# Patient Record
Sex: Female | Born: 1975 | Race: Black or African American | Hispanic: No | Marital: Single | Smoking: Current every day smoker
Health system: Southern US, Community
[De-identification: ages and names within clinical notes are randomized; demographics above are authoritative.]

## PROBLEM LIST (undated history)

## (undated) ENCOUNTER — Inpatient Hospital Stay (HOSPITAL_COMMUNITY): Payer: Self-pay

## (undated) DIAGNOSIS — O139 Gestational [pregnancy-induced] hypertension without significant proteinuria, unspecified trimester: Secondary | ICD-10-CM

## (undated) DIAGNOSIS — I10 Essential (primary) hypertension: Secondary | ICD-10-CM

## (undated) DIAGNOSIS — Z789 Other specified health status: Secondary | ICD-10-CM

## (undated) DIAGNOSIS — IMO0002 Reserved for concepts with insufficient information to code with codable children: Secondary | ICD-10-CM

## (undated) DIAGNOSIS — O24419 Gestational diabetes mellitus in pregnancy, unspecified control: Secondary | ICD-10-CM

## (undated) DIAGNOSIS — O1490 Unspecified pre-eclampsia, unspecified trimester: Secondary | ICD-10-CM

## (undated) DIAGNOSIS — O0930 Supervision of pregnancy with insufficient antenatal care, unspecified trimester: Secondary | ICD-10-CM

## (undated) DIAGNOSIS — K219 Gastro-esophageal reflux disease without esophagitis: Secondary | ICD-10-CM

## (undated) HISTORY — DX: Gastro-esophageal reflux disease without esophagitis: K21.9

## (undated) HISTORY — DX: Essential (primary) hypertension: I10

---

## 2002-03-12 HISTORY — PX: LAPAROSCOPIC OVARIAN: SHX5906

## 2012-10-13 ENCOUNTER — Other Ambulatory Visit (HOSPITAL_COMMUNITY): Payer: Self-pay | Admitting: Family

## 2012-10-13 DIAGNOSIS — Z0489 Encounter for examination and observation for other specified reasons: Secondary | ICD-10-CM

## 2012-10-13 LAB — OB RESULTS CONSOLE ABO/RH: RH Type: POSITIVE

## 2012-10-13 LAB — OB RESULTS CONSOLE PLATELET COUNT: Platelets: 194 10*3/uL

## 2012-10-13 LAB — OB RESULTS CONSOLE ANTIBODY SCREEN: Antibody Screen: NEGATIVE

## 2012-10-13 LAB — OB RESULTS CONSOLE VARICELLA ZOSTER ANTIBODY, IGG: Varicella: IMMUNE

## 2012-10-13 LAB — OB RESULTS CONSOLE RPR
RPR: NONREACTIVE
RPR: NONREACTIVE

## 2012-10-13 LAB — GLUCOSE TOLERANCE, 1 HOUR (50G) W/O FASTING: Glucose, 1 Hour GTT: 152

## 2012-10-13 LAB — OB RESULTS CONSOLE HEPATITIS B SURFACE ANTIGEN: Hepatitis B Surface Ag: NEGATIVE

## 2012-10-13 LAB — OB RESULTS CONSOLE GC/CHLAMYDIA: Chlamydia: NEGATIVE

## 2012-10-13 LAB — OB RESULTS CONSOLE HGB/HCT, BLOOD: Hemoglobin: 10.6 g/dL

## 2012-10-13 LAB — OB RESULTS CONSOLE RUBELLA ANTIBODY, IGM: Rubella: NON-IMMUNE/NOT IMMUNE

## 2012-10-14 LAB — GLUCOSE TOLERANCE, 3 HOURS: Glucose, GTT - 3 Hour: 145 mg/dL — AB (ref ?–140)

## 2012-10-16 ENCOUNTER — Ambulatory Visit (HOSPITAL_COMMUNITY)
Admission: RE | Admit: 2012-10-16 | Discharge: 2012-10-16 | Disposition: A | Discharge status: Home / Self Care | Payer: Medicaid Other | Source: Ambulatory Visit | Attending: Family | Admitting: Family

## 2012-10-16 DIAGNOSIS — Z0489 Encounter for examination and observation for other specified reasons: Secondary | ICD-10-CM

## 2012-10-16 DIAGNOSIS — Z1389 Encounter for screening for other disorder: Secondary | ICD-10-CM | POA: Insufficient documentation

## 2012-10-16 DIAGNOSIS — O09519 Supervision of elderly primigravida, unspecified trimester: Secondary | ICD-10-CM | POA: Insufficient documentation

## 2012-10-16 DIAGNOSIS — O358XX0 Maternal care for other (suspected) fetal abnormality and damage, not applicable or unspecified: Secondary | ICD-10-CM | POA: Insufficient documentation

## 2012-10-16 DIAGNOSIS — Z363 Encounter for antenatal screening for malformations: Secondary | ICD-10-CM | POA: Insufficient documentation

## 2012-10-27 ENCOUNTER — Encounter: Payer: Self-pay | Admitting: *Deleted

## 2012-10-27 ENCOUNTER — Encounter: Payer: Self-pay | Admitting: Family Medicine

## 2012-10-27 ENCOUNTER — Ambulatory Visit (INDEPENDENT_AMBULATORY_CARE_PROVIDER_SITE_OTHER): Payer: Medicaid Other | Admitting: Family Medicine

## 2012-10-27 VITALS — BP 132/90 | Temp 98.2°F | Ht 64.0 in | Wt 194.0 lb

## 2012-10-27 DIAGNOSIS — O99810 Abnormal glucose complicating pregnancy: Secondary | ICD-10-CM

## 2012-10-27 DIAGNOSIS — E119 Type 2 diabetes mellitus without complications: Secondary | ICD-10-CM

## 2012-10-27 DIAGNOSIS — O24419 Gestational diabetes mellitus in pregnancy, unspecified control: Secondary | ICD-10-CM

## 2012-10-27 LAB — POCT URINALYSIS DIP (DEVICE)
Leukocytes, UA: NEGATIVE
Nitrite: NEGATIVE
Protein, ur: NEGATIVE mg/dL
Urobilinogen, UA: 0.2 mg/dL (ref 0.0–1.0)

## 2012-10-27 MED ORDER — GLUCOSE BLOOD VI STRP: ORAL_STRIP | Status: DC

## 2012-10-27 MED ORDER — ACCU-CHEK FASTCLIX LANCETS MISC: 1.0000 [IU] | Freq: Three times a day (TID) | Status: DC

## 2012-10-27 NOTE — Progress Notes (Signed)
GDM Education:Patient [redacted]w[redacted]d,presents with new Diagnosis of GDM. First baby.dispensed Accu ck Nano glucometer. (Lot [id number], exp: 12-09-13) Order for testing supplies provided for 4Xday testing. Testing Procedure education with teach back technique. Patient and significant other verbalized understanding.

## 2012-10-27 NOTE — Progress Notes (Signed)
Pulse- 124 

## 2012-10-27 NOTE — Patient Instructions (Signed)
Gestational Diabetes Mellitus Gestational diabetes mellitus, often simply referred to as gestational diabetes, is a type of diabetes that some women develop during pregnancy. In gestational diabetes, the pancreas does not make enough insulin (a hormone), the cells are less responsive to the insulin that is made (insulin resistance), or both.Normally, insulin moves sugars from food into the tissue cells. The tissue cells use the sugars for energy. The lack of insulin or the lack of normal response to insulin causes excess sugars to build up in the blood instead of going into the tissue cells. As a result, high blood sugar (hyperglycemia) develops. The effect of high sugar (glucose) levels can cause many complications.  RISK FACTORS You have an increased chance of developing gestational diabetes if you have a family history of diabetes and also have one or more of the following risk factors:  A body mass index over 30 (obesity).  A previous pregnancy with gestational diabetes.  An older age at the time of pregnancy. If blood glucose levels are kept in the normal range during pregnancy, women can have a healthy pregnancy. If your blood glucose levels are not well controlled, there may be risks to you, your unborn baby (fetus), your labor and delivery, or your newborn baby.  SYMPTOMS  If symptoms are experienced, they are much like symptoms you would normally expect during pregnancy. The symptoms of gestational diabetes include:   Increased thirst (polydipsia).  Increased urination (polyuria).  Increased urination during the night (nocturia).  Weight loss. This weight loss may be rapid.  Frequent, recurring infections.  Tiredness (fatigue).  Weakness.  Vision changes, such as blurred vision.  Fruity smell to your breath.  Abdominal pain. DIAGNOSIS Diabetes is diagnosed when blood glucose levels are increased. Your blood glucose level may be checked by one or more of the following  blood tests:  A fasting blood glucose test. You will not be allowed to eat for at least 8 hours before a blood sample is taken.  A random blood glucose test. Your blood glucose is checked at any time of the day regardless of when you ate.  A hemoglobin A1c blood glucose test. A hemoglobin A1c test provides information about blood glucose control over the previous 3 months.  An oral glucose tolerance test (OGTT). Your blood glucose is measured after you have not eaten (fasted) for 1 3 hours and then after you drink a glucose-containing beverage. Since the hormones that cause insulin resistance are highest at about 24 28 weeks of a pregnancy, an OGTT is usually performed during that time. If you have risk factors for gestational diabetes, your caregiver may test you for gestational diabetes earlier than 24 weeks of pregnancy. TREATMENT   You will need to take diabetes medicine or insulin daily to keep blood glucose levels in the desired range.  You will need to match insulin dosing with exercise and healthy food choices. The treatment goal is to maintain the before meal (preprandial), bedtime, and overnight blood glucose level at 60 99 mg/dL during pregnancy. The treatment goal is to further maintain peak after meal blood sugar (postprandial glucose) level at 100 140 mg/dL.  HOME CARE INSTRUCTIONS   Have your hemoglobin A1c level checked twice a year.  Perform daily blood glucose monitoring as directed by your caregiver. It is common to perform frequent blood glucose monitoring.  Monitor urine ketones when you are ill and as directed by your caregiver.  Take your diabetes medicine and insulin as directed by your caregiver to   maintain your blood glucose level in the desired range.  Never run out of diabetes medicine or insulin. It is needed every day.  Adjust insulin based on your intake of carbohydrates. Carbohydrates can raise blood glucose levels but need to be included in your diet.  Carbohydrates provide vitamins, minerals, and fiber which are an essential part of a healthy diet. Carbohydrates are found in fruits, vegetables, whole grains, dairy products, legumes, and foods containing added sugars.    Eat healthy foods. Alternate 3 meals with 3 snacks.  Maintain a healthy weight gain. The usual total expected weight gain varies according to your prepregnancy body mass index (BMI).  Carry a medical alert card or wear your medical alert jewelry.  Carry a 15 gram carbohydrate snack with you at all times to treat low blood glucose (hypoglycemia). Some examples of 15 gram carbohydrate snacks include:  Glucose tablets, 3 or 4   Glucose gel, 15 gram tube  Raisins, 2 tablespoons (24 g)  Jelly beans, 6  Animal crackers, 8  Fruit juice, regular soda, or low fat milk, 4 ounces (120 mL)  Gummy treats, 9    Recognize hypoglycemia. Hypoglycemia during pregnancy occurs with blood glucose levels of 60 mg/dL and below. The risk for hypoglycemia increases when fasting or skipping meals, during or after intense exercise, and during sleep. Hypoglycemia symptoms can include:  Tremors or shakes.  Decreased ability to concentrate.  Sweating.  Increased heart rate.  Headache.  Dry mouth.  Hunger.  Irritability.  Anxiety.  Restless sleep.  Altered speech or coordination.  Confusion.  Treat hypoglycemia promptly. If you are alert and able to safely swallow, follow the 15:15 rule:  Take 15 20 grams of rapid-acting glucose or carbohydrate. Rapid-acting options include glucose gel, glucose tablets, or 4 ounces (120 mL) of fruit juice, regular soda, or low fat milk.  Check your blood glucose level 15 minutes after taking the glucose.   Take 15 20 grams more of glucose if the repeat blood glucose level is still 70 mg/dL or below.  Eat a meal or snack within 1 hour once blood glucose levels return to normal.  Be alert to polyuria and polydipsia which are early  signs of hyperglycemia. An early awareness of hyperglycemia allows for prompt treatment. Treat hyperglycemia as directed by your caregiver.  Engage in at least 30 minutes of physical activity a day or as directed by your caregiver. Ten minutes of physical activity timed 30 minutes after each meal is encouraged to control postprandial blood glucose levels.  Adjust your insulin dosing and food intake as needed if you start a new exercise or sport.  Follow your sick day plan at any time you are unable to eat or drink as usual.  Avoid tobacco and alcohol use.  Follow up with your caregiver regularly.  Follow the advice of your caregiver regarding your prenatal and post-delivery (postpartum) appointments, meal planning, exercise, medicines, vitamins, blood tests, other medical tests, and physical activities.  Perform daily skin and foot care. Examine your skin and feet daily for cuts, bruises, redness, nail problems, bleeding, blisters, or sores.  Brush your teeth and gums at least twice a day and floss at least once a day. Follow up with your dentist regularly.  Schedule an eye exam during the first trimester of your pregnancy or as directed by your caregiver.  Share your diabetes management plan with your workplace or school.  Stay up-to-date with immunizations.  Learn to manage stress.  Obtain ongoing diabetes education   and support as needed. SEEK MEDICAL CARE IF:   You are unable to eat food or drink fluids for more than 6 hours.  You have nausea and vomiting for more than 6 hours.  You have a blood glucose level of 200 mg/dL and you have ketones in your urine.  There is a change in mental status.  You develop vision problems.  You have a persistent headache.  You have upper abdominal pain or discomfort.  You develop an additional serious illness.  You have diarrhea for more than 6 hours.  You have been sick or have had a fever for a couple of days and are not getting  better. SEEK IMMEDIATE MEDICAL CARE IF:   You have difficulty breathing.  You no longer feel the baby moving.  You are bleeding or have discharge from your vagina.  You start having premature contractions or labor. MAKE SURE YOU:  Understand these instructions.  Will watch your condition.  Will get help right away if you are not doing well or get worse. Document Released: 06/04/2000 Document Revised: 11/21/2011 Document Reviewed: 09/25/2011 [employer] Patient Information 2014 [employer].  

## 2012-10-27 NOTE — Progress Notes (Signed)
Nutrition note: 1st visit consult & GDM diet education Pt is newly diagnosed GDM pt. Pt has gained 39# @ [redacted]w[redacted]d, which is > expected. Pt reports eating 3 meals & 3-4 snacks/d. Pt reports having nausea occ and heartburn. Pt is taking PNV. Pt received verbal & written education on GDM diet. Discussed wt gain goals of 15-25# or 0.6#/wk. Pt agrees to follow GDM diet with 3 meals & 3 snacks/d with proper CHO/ protein combination. Pt has WIC and plans to BF. F/u in 2-4 wks Blondell Reveal, MS, RD, LDN

## 2012-10-27 NOTE — Progress Notes (Signed)
  Subjective:    Catherine [name] is a 37 y.o. G1P0000 at [redacted]w[redacted]d gestation being seen today for her obstetrical visit. Her obstetrical history is significant for diabetes mellitus, gestational. Other risk factors include: obesity. Pregnancy history fully reviewed. Patient reports nausea, no bleeding, no contractions, no cramping and no leaking. Fetal movement: normal. This is her first visit after having an elevated 3hr glucola.   Menstrual History: OB History   Grav Para Term Preterm Abortions TAB SAB Ect Mult Living   1 0 0 0 0 0 0 0 0 0        No LMP recorded. Patient is pregnant.    The following portions of the patient's history were reviewed and updated as appropriate: allergies, current medications, past family history, past medical history, past social history, past surgical history and problem list.  Review of Systems Pertinent items are noted in HPI.    Objective:    BP 132/90  Temp(Src) 98.2 F (36.8 C)  Ht 5\' 4"  (1.626 m)  Wt 87.998 kg (194 lb)  BMI 33.28 kg/m2 General:   alert, cooperative, appears stated age and no distress  FHT:  150 BPM  Uterine Size: 31 cm and size equals dates   Lab Review Urine dip: neg for glucose      Assessment:    1. Intrauterine pregnancy of [redacted]w[redacted]d, with normal growth. 2. Diabetes Mellitus first visit. Pt to be seen by Diabetic educator, nutrition, and social work. 3. NST: Start after next week evaluation.     No results found for this basename: glucose     Plan:   Catherine [name] is a 37 y.o. G1P0000 at [redacted]w[redacted]d by R=30 here for ROB visit.  Discussed with Patient:  -Plans to breast/bottle feed.  All questions answered. -Continue prenatal vitamins. -Reviewed fetal kick counts Pt to perform daily at a time when the baby is active, lie laterally with both hands on belly in quiet room and count all movements (hiccups, shoulder rolls, obvious kicks, etc); pt is to report to clinic L&D for less than 10 movements felt in a one hour time  period-pt told as soon as she counts 10 movements the count is complete.  - Routine precautions discussed (depression, infection s/s).   Patient provided with all pertinent phone numbers for emergencies. - RTC for any VB, regular, painful cramps/ctxs occurring at a rate of >2/10 min, fever (100.5 or higher), n/v/d, any pain that is unresolving or worsening, LOF, decreased fetal movement, CP, SOB, edema - RTC in 1 weeks for next appt to discuss sugars and plan. - Pt to meet with Nutrition and Diabetic nurse to establish treatment plan.  Problems: Patient Active Problem List   Diagnosis Date Noted  . Gestational diabetes mellitus in pregnancy 10/27/2012    To Do: 1. Tdap thrid trimester  [ ]  Vaccines: Flu:  Tdap:  [ ]  BCM: Undecided  Edu: [x ] PTL precautions; [ ]  BF class; [ ]  childbirth class; [ ]   BF counseling;

## 2012-10-28 DIAGNOSIS — O99810 Abnormal glucose complicating pregnancy: Secondary | ICD-10-CM

## 2012-10-30 ENCOUNTER — Encounter: Payer: Self-pay | Admitting: *Deleted

## 2012-11-03 ENCOUNTER — Ambulatory Visit (INDEPENDENT_AMBULATORY_CARE_PROVIDER_SITE_OTHER): Payer: Medicaid Other | Admitting: Family Medicine

## 2012-11-03 ENCOUNTER — Encounter: Payer: Self-pay | Admitting: *Deleted

## 2012-11-03 VITALS — BP 135/78 | Temp 97.1°F | Wt 197.0 lb

## 2012-11-03 DIAGNOSIS — K219 Gastro-esophageal reflux disease without esophagitis: Secondary | ICD-10-CM

## 2012-11-03 DIAGNOSIS — O99810 Abnormal glucose complicating pregnancy: Secondary | ICD-10-CM

## 2012-11-03 HISTORY — DX: Gastro-esophageal reflux disease without esophagitis: K21.9

## 2012-11-03 LAB — POCT URINALYSIS DIP (DEVICE)
Bilirubin Urine: NEGATIVE
Hgb urine dipstick: NEGATIVE
Ketones, ur: NEGATIVE mg/dL
Leukocytes, UA: NEGATIVE
Specific Gravity, Urine: 1.02 (ref 1.005–1.030)
pH: 7 (ref 5.0–8.0)

## 2012-11-03 NOTE — Progress Notes (Signed)
FBS 96-111--probably not true fasting, secondary to milk drinking in early am.--trial of Zantac for GERD instead of milk 2 hr pp 75-116 NST reviewed and reactive.

## 2012-11-03 NOTE — Progress Notes (Signed)
Nutrition note: f/u GDM diet Pt has gained 42# @ [redacted]w[redacted]d, which is > expected and rapid (3# in 1wk). Pt reports eating 3 meals & 1-2 snacks/d but is hungry throughout the day. Pt's BS range: Fasting-96-111 and 2hr pp- 75-116 Pt has been drinking milk in the middle of the night due to heartburn, which could be 1 reason for elevated fasting BS. Pt is taking PNV. Reviewed GDM diet and discussed snack ideas with pt. Discussed wt gain goals of 0.6#/wk. Pt agrees to stop drinking milk during the night and try Zantac as prescribed by doctor. F/u in 2-4 wks Blondell Reveal, MS, RD, LDN

## 2012-11-03 NOTE — Patient Instructions (Signed)
Gestational Diabetes Mellitus Gestational diabetes mellitus, often simply referred to as gestational diabetes, is a type of diabetes that some women develop during pregnancy. In gestational diabetes, the pancreas does not make enough insulin (a hormone), the cells are less responsive to the insulin that is made (insulin resistance), or both.Normally, insulin moves sugars from food into the tissue cells. The tissue cells use the sugars for energy. The lack of insulin or the lack of normal response to insulin causes excess sugars to build up in the blood instead of going into the tissue cells. As a result, high blood sugar (hyperglycemia) develops. The effect of high sugar (glucose) levels can cause many complications.  RISK FACTORS You have an increased chance of developing gestational diabetes if you have a family history of diabetes and also have one or more of the following risk factors:  A body mass index over 30 (obesity).  A previous pregnancy with gestational diabetes.  An older age at the time of pregnancy. If blood glucose levels are kept in the normal range during pregnancy, women can have a healthy pregnancy. If your blood glucose levels are not well controlled, there may be risks to you, your unborn baby (fetus), your labor and delivery, or your newborn baby.  SYMPTOMS  If symptoms are experienced, they are much like symptoms you would normally expect during pregnancy. The symptoms of gestational diabetes include:   Increased thirst (polydipsia).  Increased urination (polyuria).  Increased urination during the night (nocturia).  Weight loss. This weight loss may be rapid.  Frequent, recurring infections.  Tiredness (fatigue).  Weakness.  Vision changes, such as blurred vision.  Fruity smell to your breath.  Abdominal pain. DIAGNOSIS Diabetes is diagnosed when blood glucose levels are increased. Your blood glucose level may be checked by one or more of the following  blood tests:  A fasting blood glucose test. You will not be allowed to eat for at least 8 hours before a blood sample is taken.  A random blood glucose test. Your blood glucose is checked at any time of the day regardless of when you ate.  A hemoglobin A1c blood glucose test. A hemoglobin A1c test provides information about blood glucose control over the previous 3 months.  An oral glucose tolerance test (OGTT). Your blood glucose is measured after you have not eaten (fasted) for 1 3 hours and then after you drink a glucose-containing beverage. Since the hormones that cause insulin resistance are highest at about 24 28 weeks of a pregnancy, an OGTT is usually performed during that time. If you have risk factors for gestational diabetes, your caregiver may test you for gestational diabetes earlier than 24 weeks of pregnancy. TREATMENT   You will need to take diabetes medicine or insulin daily to keep blood glucose levels in the desired range.  You will need to match insulin dosing with exercise and healthy food choices. The treatment goal is to maintain the before meal (preprandial), bedtime, and overnight blood glucose level at 60 99 mg/dL during pregnancy. The treatment goal is to further maintain peak after meal blood sugar (postprandial glucose) level at 100 140 mg/dL.  HOME CARE INSTRUCTIONS   Have your hemoglobin A1c level checked twice a year.  Perform daily blood glucose monitoring as directed by your caregiver. It is common to perform frequent blood glucose monitoring.  Monitor urine ketones when you are ill and as directed by your caregiver.  Take your diabetes medicine and insulin as directed by your caregiver to   maintain your blood glucose level in the desired range.  Never run out of diabetes medicine or insulin. It is needed every day.  Adjust insulin based on your intake of carbohydrates. Carbohydrates can raise blood glucose levels but need to be included in your diet.  Carbohydrates provide vitamins, minerals, and fiber which are an essential part of a healthy diet. Carbohydrates are found in fruits, vegetables, whole grains, dairy products, legumes, and foods containing added sugars.    Eat healthy foods. Alternate 3 meals with 3 snacks.  Maintain a healthy weight gain. The usual total expected weight gain varies according to your prepregnancy body mass index (BMI).  Carry a medical alert card or wear your medical alert jewelry.  Carry a 15 gram carbohydrate snack with you at all times to treat low blood glucose (hypoglycemia). Some examples of 15 gram carbohydrate snacks include:  Glucose tablets, 3 or 4   Glucose gel, 15 gram tube  Raisins, 2 tablespoons (24 g)  Jelly beans, 6  Animal crackers, 8  Fruit juice, regular soda, or low fat milk, 4 ounces (120 mL)  Gummy treats, 9    Recognize hypoglycemia. Hypoglycemia during pregnancy occurs with blood glucose levels of 60 mg/dL and below. The risk for hypoglycemia increases when fasting or skipping meals, during or after intense exercise, and during sleep. Hypoglycemia symptoms can include:  Tremors or shakes.  Decreased ability to concentrate.  Sweating.  Increased heart rate.  Headache.  Dry mouth.  Hunger.  Irritability.  Anxiety.  Restless sleep.  Altered speech or coordination.  Confusion.  Treat hypoglycemia promptly. If you are alert and able to safely swallow, follow the 15:15 rule:  Take 15 20 grams of rapid-acting glucose or carbohydrate. Rapid-acting options include glucose gel, glucose tablets, or 4 ounces (120 mL) of fruit juice, regular soda, or low fat milk.  Check your blood glucose level 15 minutes after taking the glucose.   Take 15 20 grams more of glucose if the repeat blood glucose level is still 70 mg/dL or below.  Eat a meal or snack within 1 hour once blood glucose levels return to normal.  Be alert to polyuria and polydipsia which are early  signs of hyperglycemia. An early awareness of hyperglycemia allows for prompt treatment. Treat hyperglycemia as directed by your caregiver.  Engage in at least 30 minutes of physical activity a day or as directed by your caregiver. Ten minutes of physical activity timed 30 minutes after each meal is encouraged to control postprandial blood glucose levels.  Adjust your insulin dosing and food intake as needed if you start a new exercise or sport.  Follow your sick day plan at any time you are unable to eat or drink as usual.  Avoid tobacco and alcohol use.  Follow up with your caregiver regularly.  Follow the advice of your caregiver regarding your prenatal and post-delivery (postpartum) appointments, meal planning, exercise, medicines, vitamins, blood tests, other medical tests, and physical activities.  Perform daily skin and foot care. Examine your skin and feet daily for cuts, bruises, redness, nail problems, bleeding, blisters, or sores.  Brush your teeth and gums at least twice a day and floss at least once a day. Follow up with your dentist regularly.  Schedule an eye exam during the first trimester of your pregnancy or as directed by your caregiver.  Share your diabetes management plan with your workplace or school.  Stay up-to-date with immunizations.  Learn to manage stress.  Obtain ongoing diabetes education   and support as needed. SEEK MEDICAL CARE IF:   You are unable to eat food or drink fluids for more than 6 hours.  You have nausea and vomiting for more than 6 hours.  You have a blood glucose level of 200 mg/dL and you have ketones in your urine.  There is a change in mental status.  You develop vision problems.  You have a persistent headache.  You have upper abdominal pain or discomfort.  You develop an additional serious illness.  You have diarrhea for more than 6 hours.  You have been sick or have had a fever for a couple of days and are not getting  better. SEEK IMMEDIATE MEDICAL CARE IF:   You have difficulty breathing.  You no longer feel the baby moving.  You are bleeding or have discharge from your vagina.  You start having premature contractions or labor. MAKE SURE YOU:  Understand these instructions.  Will watch your condition.  Will get help right away if you are not doing well or get worse. Document Released: 06/04/2000 Document Revised: 11/21/2011 Document Reviewed: 09/25/2011 [employer] Patient Information 2014 [employer].  Breastfeeding A change in hormones during your pregnancy causes growth of your breast tissue and an increase in number and size of milk ducts. The hormone prolactin allows proteins, sugars, and fats from your blood supply to make breast milk in your milk-producing glands. The hormone progesterone prevents breast milk from being released before the birth of your baby. After the birth of your baby, your progesterone level decreases allowing breast milk to be released. Thoughts of your baby, as well as his or her sucking or crying, can stimulate the release of milk from the milk-producing glands. Deciding to breastfeed (nurse) is one of the best choices you can make for you and your baby. The information that follows gives a brief review of the benefits, as well as other important skills to know about breastfeeding. BENEFITS OF BREASTFEEDING For your baby  The first milk (colostrum) helps your baby's digestive system function better.   There are antibodies in your milk that help your baby fight off infections.   Your baby has a lower incidence of asthma, allergies, and sudden infant death syndrome (SIDS).   The nutrients in breast milk are better for your baby than infant formulas.  Breast milk improves your baby's brain development.   Your baby will have less gas, colic, and constipation.  Your baby is less likely to develop other conditions, such as childhood obesity, asthma, or diabetes  mellitus. For you  Breastfeeding helps develop a very special bond between you and your baby.   Breastfeeding is convenient, always available at the correct temperature, and costs nothing.   Breastfeeding helps to burn calories and helps you lose the weight gained during pregnancy.   Breastfeeding makes your uterus contract back down to normal size faster and slows bleeding following delivery.   Breastfeeding mothers have a lower risk of developing osteoporosis or breast or ovarian cancer later in life.  BREASTFEEDING FREQUENCY  A healthy, full-term baby may breastfeed as often as every hour or space his or her feedings to every 3 hours. Breastfeeding frequency will vary from baby to baby.   Newborns should be fed no less than every 2 3 hours during the day and every 4 5 hours during the night. You should breastfeed a minimum of 8 feedings in a 24 hour period.  Awaken your baby to breastfeed if it has been 3 4   hours since the last feeding.  Breastfeed when you feel the need to reduce the fullness of your breasts or when your newborn shows signs of hunger. Signs that your baby may be hungry include:  Increased alertness or activity.  Stretching.  Movement of the head from side to side.  Movement of the head and opening of the mouth when the corner of the mouth or cheek is stroked (rooting).  Increased sucking sounds, smacking lips, cooing, sighing, or squeaking.  Hand-to-mouth movements.  Increased sucking of fingers or hands.  Fussing.  Intermittent crying.  Signs of extreme hunger will require calming and consoling before you try to feed your baby. Signs of extreme hunger may include:  Restlessness.  A loud, strong cry.  Screaming.  Frequent feeding will help you make more milk and will help prevent problems, such as sore nipples and engorgement of the breasts.  BREASTFEEDING   Whether lying down or sitting, be sure that the baby's abdomen is facing your  abdomen.   Support your breast with 4 fingers under your breast and your thumb above your nipple. Make sure your fingers are well away from your nipple and your baby's mouth.   Stroke your baby's lips gently with your finger or nipple.   When your baby's mouth is open wide enough, place all of your nipple and as much of the colored area around your nipple (areola) as possible into your baby's mouth.  More areola should be visible above his or her upper lip than below his or her lower lip.  Your baby's tongue should be between his or her lower gum and your breast.  Ensure that your baby's mouth is correctly positioned around the nipple (latched). Your baby's lips should create a seal on your breast.  Signs that your baby has effectively latched onto your nipple include:  Tugging or sucking without pain.  Swallowing heard between sucks.  Absent click or smacking sound.  Muscle movement above and in front of his or her ears with sucking.  Your baby must suck about 2 3 minutes in order to get your milk. Allow your baby to feed on each breast as long as he or she wants. Nurse your baby until he or she unlatches or falls asleep at the first breast, then offer the second breast.  Signs that your baby is full and satisfied include:  A gradual decrease in the number of sucks or complete cessation of sucking.  Falling asleep.  Extension or relaxation of his or her body.  Retention of a small amount of milk in his or her mouth.  Letting go of your breast by himself or herself.  Signs of effective breastfeeding in you include:  Breasts that have increased firmness, weight, and size prior to feeding.  Breasts that are softer after nursing.  Increased milk volume, as well as a change in milk consistency and color by the 5th day of breastfeeding.  Breast fullness relieved by breastfeeding.  Nipples are not sore, cracked, or bleeding.  If needed, break the suction by putting your  finger into the corner of your baby's mouth and sliding your finger between his or her gums. Then, remove your breast from his or her mouth.  It is common for babies to spit up a small amount after a feeding.  Babies often swallow air during feeding. This can make babies fussy. Burping your baby between breasts can help with this.  Vitamin D supplements are recommended for babies who get only   breast milk.  Avoid using a pacifier during your baby's first 4 6 weeks.  Avoid supplemental feedings of water, formula, or juice in place of breastfeeding. Breast milk is all the food your baby needs. It is not necessary for your baby to have water or formula. Your breasts will make more milk if supplemental feedings are avoided during the early weeks. HOW TO TELL WHETHER YOUR BABY IS GETTING ENOUGH BREAST MILK Wondering whether or not your baby is getting enough milk is a common concern among mothers. You can be assured that your baby is getting enough milk if:   Your baby is actively sucking and you hear swallowing.   Your baby seems relaxed and satisfied after a feeding.   Your baby nurses at least 8 12 times in a 24 hour time period.  During the first 3 5 days of age:  Your baby is wetting at least 3 5 diapers in a 24 hour period. The urine should be clear and pale yellow.  Your baby is having at least 3 4 stools in a 24 hour period. The stool should be soft and yellow.  At 5 7 days of age, your baby is having at least 3 6 stools in a 24 hour period. The stool should be seedy and yellow by 5 days of age.  Your baby has a weight loss less than 7 10% during the first 3 days of age.  Your baby does not lose weight after 3 7 days of age.  Your baby gains 4 7 ounces each week after he or she is 4 days of age.  Your baby gains weight by 5 days of age and is back to birth weight within 2 weeks. ENGORGEMENT In the first week after your baby is born, you may experience extremely full breasts  (engorgement). When engorged, your breasts may feel heavy, warm, or tender to the touch. Engorgement peaks within 24 48 hours after delivery of your baby.  Engorgement may be reduced by:  Continuing to breastfeed.  Increasing the frequency of breastfeeding.  Taking warm showers or applying warm, moist heat to your breasts just before each feeding. This increases circulation and helps the milk flow.   Gently massaging your breast before and during the feedings. With your fingertips, massage from your chest wall towards your nipple in a circular motion.   Ensuring that your baby empties at least one breast at every feeding. It also helps to start the next feeding on the opposite breast.   Expressing breast milk by hand or by using a breast pump to empty the breasts if your baby is sleepy, or not nursing well. You may also want to express milk if you are returning to work oryou feel you are getting engorged.  Ensuring your baby is latched on and positioned properly while breastfeeding. If you follow these suggestions, your engorgement should improve in 24 48 hours. If you are still experiencing difficulty, call your lactation consultant or caregiver.  CARING FOR YOURSELF Take care of your breasts.  Bathe or shower daily.   Avoid using soap on your nipples.   Wear a supportive bra. Avoid wearing underwire style bras.  Air dry your nipples for a 3 4minutes after each feeding.   Use only cotton bra pads to absorb breast milk leakage. Leaking of breast milk between feedings is normal.   Use only pure lanolin on your nipples after nursing. You do not need to wash it off before feeding your baby again.   Another option is to express a few drops of breast milk and gently massage that milk into your nipples.  Continue breast self-awareness checks. Take care of yourself.  Eat healthy foods. Alternate 3 meals with 3 snacks.  Avoid foods that you notice affect your baby in a bad  way.  Drink milk, fruit juice, and water to satisfy your thirst (about 8 glasses a day).   Rest often, relax, and take your prenatal vitamins to prevent fatigue, stress, and anemia.  Avoid chewing and smoking tobacco.  Avoid alcohol and drug use.  Take over-the-counter and prescribed medicine only as directed by your caregiver or pharmacist. You should always check with your caregiver or pharmacist before taking any new medicine, vitamin, or herbal supplement.  Know that pregnancy is possible while breastfeeding. If desired, talk to your caregiver about family planning and safe birth control methods that may be used while breastfeeding. SEEK MEDICAL CARE IF:   You feel like you want to stop breastfeeding or have become frustrated with breastfeeding.  You have painful breasts or nipples.  Your nipples are cracked or bleeding.  Your breasts are red, tender, or warm.  You have a swollen area on either breast.  You have a fever or chills.  You have nausea or vomiting.  You have drainage from your nipples.  Your breasts do not become full before feedings by the 5th day after delivery.  You feel sad and depressed.  Your baby is too sleepy to eat well.  Your baby is having trouble sleeping.   Your baby is wetting less than 3 diapers in a 24 hour period.  Your baby has less than 3 stools in a 24 hour period.  Your baby's skin or the white part of his or her eyes becomes more yellow.   Your baby is not gaining weight by 5 days of age. MAKE SURE YOU:   Understand these instructions.  Will watch your condition.  Will get help right away if you are not doing well or get worse. Document Released: 02/26/2005 Document Revised: 11/21/2011 Document Reviewed: 10/03/2011 [employer] Patient Information 2014 [employer].  

## 2012-11-03 NOTE — Progress Notes (Signed)
Pulse 105 Edema trace in face.

## 2012-11-03 NOTE — Progress Notes (Signed)
Pt to start NST protocol today if CBG's require medication for control.  Pt has not had bedtime snack routinely but has had milk at 0100 due to heartburn.  CBG log book reviewed with Lenor Coffin RN to determine if medication indicated for glucose control. Fastings 98-111, PP all wnl range.  She recommends Glyburide 2.5 mg @ bedtime daily. NST protocol initiated.

## 2012-11-07 ENCOUNTER — Ambulatory Visit (INDEPENDENT_AMBULATORY_CARE_PROVIDER_SITE_OTHER): Payer: Medicaid Other | Admitting: *Deleted

## 2012-11-07 ENCOUNTER — Ambulatory Visit (HOSPITAL_COMMUNITY)
Admission: RE | Admit: 2012-11-07 | Discharge: 2012-11-07 | Disposition: A | Discharge status: Home / Self Care | Payer: Medicaid Other | Source: Ambulatory Visit | Attending: Obstetrics & Gynecology | Admitting: Obstetrics & Gynecology

## 2012-11-07 ENCOUNTER — Encounter (HOSPITAL_COMMUNITY): Payer: Self-pay | Admitting: *Deleted

## 2012-11-07 ENCOUNTER — Inpatient Hospital Stay (HOSPITAL_COMMUNITY)
Admission: AD | Admit: 2012-11-07 | Discharge: 2012-11-07 | Disposition: A | Discharge status: Home / Self Care | Payer: Medicaid Other | Source: Ambulatory Visit | Attending: Obstetrics & Gynecology | Admitting: Obstetrics & Gynecology

## 2012-11-07 VITALS — BP 125/82

## 2012-11-07 DIAGNOSIS — O163 Unspecified maternal hypertension, third trimester: Secondary | ICD-10-CM | POA: Diagnosis present

## 2012-11-07 DIAGNOSIS — O99810 Abnormal glucose complicating pregnancy: Secondary | ICD-10-CM | POA: Insufficient documentation

## 2012-11-07 DIAGNOSIS — O4700 False labor before 37 completed weeks of gestation, unspecified trimester: Secondary | ICD-10-CM | POA: Insufficient documentation

## 2012-11-07 DIAGNOSIS — R030 Elevated blood-pressure reading, without diagnosis of hypertension: Secondary | ICD-10-CM

## 2012-11-07 DIAGNOSIS — O09519 Supervision of elderly primigravida, unspecified trimester: Secondary | ICD-10-CM | POA: Insufficient documentation

## 2012-11-07 DIAGNOSIS — O368390 Maternal care for abnormalities of the fetal heart rate or rhythm, unspecified trimester, not applicable or unspecified: Secondary | ICD-10-CM

## 2012-11-07 DIAGNOSIS — O289 Unspecified abnormal findings on antenatal screening of mother: Secondary | ICD-10-CM | POA: Insufficient documentation

## 2012-11-07 HISTORY — DX: Gestational diabetes mellitus in pregnancy, unspecified control: O24.419

## 2012-11-07 LAB — URINALYSIS, ROUTINE W REFLEX MICROSCOPIC
Glucose, UA: NEGATIVE mg/dL
Hgb urine dipstick: NEGATIVE
Specific Gravity, Urine: <1.005 — ABNORMAL LOW (ref 1.005–1.030)
Urobilinogen, UA: 0.2 mg/dL (ref 0.0–1.0)

## 2012-11-07 LAB — COMPREHENSIVE METABOLIC PANEL
AST: 14 U/L (ref 0–37)
Albumin: 2.5 g/dL — ABNORMAL LOW (ref 3.5–5.2)
Alkaline Phosphatase: 96 U/L (ref 39–117)
CO2: 21 mEq/L (ref 19–32)
Chloride: 101 mEq/L (ref 96–112)
Creatinine, Ser: 0.5 mg/dL (ref 0.50–1.10)
GFR calc non Af Amer: >90 mL/min (ref >90)
Potassium: 3.6 mEq/L (ref 3.5–5.1)
Total Bilirubin: 0.2 mg/dL — ABNORMAL LOW (ref 0.3–1.2)

## 2012-11-07 LAB — CBC
HCT: 30.3 % — ABNORMAL LOW (ref 36.0–46.0)
MCV: 85.6 fL (ref 78.0–100.0)
Platelets: 190 10*3/uL (ref 150–400)
RBC: 3.54 MIL/uL — ABNORMAL LOW (ref 3.87–5.11)
RDW: 13.8 % (ref 11.5–15.5)
WBC: 9 10*3/uL (ref 4.0–10.5)

## 2012-11-07 NOTE — MAU Provider Note (Signed)
  History     CSN: [id number]  Arrival date and time: 11/07/12 1115   First Provider Initiated Contact with Patient 11/07/12 1151      Chief Complaint  Patient presents with  . fetal monitor    HPI Comments: [name] is a 36yo G1 at [redacted]w[redacted]d with A1 GDM seen in clinic today for an NST, which she gets twice weekly. Her NST was non-reactive and BPP was 8/8. She was sent to MAU for continued monitoring. She has no history of LOF, vaginal bleeding or contractions. She feels baby moving regularly with last noticed fetal movement this morning. No complaints otherwise. Her fasting blood sugars are in the 90s range with postprandial readings less than 120.  FHT: 150bpm, moderate variability, +accelerations, no decels, Category I tracing UC: irregular every 4-5 minutes  OB History   Grav Para Term Preterm Abortions TAB SAB Ect Mult Living   1 0 0 0 0 0 0 0 0 0       Past Medical History  Diagnosis Date  . Gestational diabetes     Past Surgical History  Procedure Laterality Date  . Laparoscopic ovarian  2004    History reviewed. No pertinent family history.  History  Substance Use Topics  . Smoking status: Never Smoker   . Smokeless tobacco: Never Used  . Alcohol Use: No    Allergies: No Known Allergies  Prescriptions prior to admission  Medication Sig Dispense Refill  . Prenatal Vit-Fe Fumarate-FA (PRENATAL MULTIVITAMIN) TABS tablet Take 1 tablet by mouth daily at 12 noon.      . ranitidine (ZANTAC) 150 MG tablet Take 150 mg by mouth daily.      Marland Kitchen ACCU-CHEK FASTCLIX LANCETS MISC 1 Units by Does not apply route 4 (four) times daily - after meals and at bedtime.  360 each  1  . glucose blood (ACCU-CHEK SMARTVIEW) test strip Use as instructed  100 each  12    Review of Systems  Constitutional: Negative for fever and chills.  Eyes: Positive for blurred vision.       History of blurry vision. Currently no symptoms  Respiratory: Negative for shortness of breath.    Gastrointestinal: Positive for nausea. Negative for vomiting, abdominal pain, diarrhea and constipation.  Genitourinary: Negative for dysuria.  Musculoskeletal: Negative for back pain.  Neurological: Negative for headaches.   Physical Exam   Blood pressure 139/82, pulse 97, temperature 98.8 F (37.1 C), temperature source Oral, resp. rate 20.  Physical Exam  Nursing note reviewed. Constitutional: She is oriented to person, place, and time. She appears well-developed and well-nourished.  Cardiovascular: Regular rhythm and normal heart sounds.  Tachycardia present.   No murmur heard. Respiratory: Effort normal and breath sounds normal.  GI: There is no tenderness.  Musculoskeletal: Normal range of motion. She exhibits edema. She exhibits no tenderness.  Trace right ankle edema  Neurological: She is alert and oriented to person, place, and time. She has normal reflexes.  Skin: Skin is warm and dry. No erythema.    MAU Course  Procedures  MDM - Watch for 1 hour on fetal monitoring for decelerations  Assessment and Plan  37 yo G1 at [redacted]w[redacted]d with A1 GDM s/p non-reactive NST and 8/8 BPP. Fetal monitoring is reactive with 2x 10x10 accelerations and 1x 15x15 accerlation. - discharge home - f/u at clinic next week  Jacquelin Hawking, MD 11/07/2012, 12:15 PM   Please see edited note completed by Sid Falcon, CNM [facility]

## 2012-11-07 NOTE — Progress Notes (Signed)
P = 100   Dr. Marice Potter notified of NST result and UC pattern.  Pt sent to [place] dept for BPP, then will have further EFM and evaluation in MAU.

## 2012-11-07 NOTE — MAU Note (Signed)
Pt sent from clinic for non-reactive NST, had BPP that was 8/8, here for further monitoring.

## 2012-11-07 NOTE — Progress Notes (Signed)
CSN: [id number]   Arrival date and time: 11/07/12 1115    First Provider Initiated Contact with Patient 11/07/12 1151          Chief Complaint   Patient presents with   .  fetal monitor     HPI Comments: Catherine [name] is a 36yo G1 at [redacted]w[redacted]d with A1 GDM seen in clinic today for an NST, which she gets twice weekly. Her NST was non-reactive and BPP was 8/8. She was sent to MAU for continued monitoring. She has no history of LOF, vaginal bleeding or contractions. She feels baby moving regularly with last noticed fetal movement this morning. No complaints otherwise. Her fasting blood sugars are in the 90s range with postprandial readings less than 120.  No report of headache, epigastric pain, or vision changes.    FHT: 150bpm, moderate variability, +accelerations, no decels, Category I tracing UC: irregular every 4-5 minutes    OB History     Grav  Para  Term  Preterm  Abortions  TAB  SAB  Ect  Mult  Living     1  0  0  0  0  0  0  0  0  0       Past Medical History   Diagnosis  Date   .  Gestational diabetes         Past Surgical History   Procedure  Laterality  Date   .  Laparoscopic ovarian    2004      History reviewed. No pertinent family history.    History   Substance Use Topics   .  Smoking status:  Never Smoker    .  Smokeless tobacco:  Never Used   .  Alcohol Use:  No      Allergies: No Known Allergies    Prescriptions prior to admission   Medication  Sig  Dispense  Refill   .  Prenatal Vit-Fe Fumarate-FA (PRENATAL MULTIVITAMIN) TABS tablet  Take 1 tablet by mouth daily at 12 noon.         .  ranitidine (ZANTAC) 150 MG tablet  Take 150 mg by mouth daily.         Marland Kitchen  ACCU-CHEK FASTCLIX LANCETS MISC  1 Units by Does not apply route 4 (four) times daily - after meals and at bedtime.   360 each   1   .  glucose blood (ACCU-CHEK SMARTVIEW) test strip  Use as instructed   100 each   12      Review of Systems  Constitutional: Negative for fever and chills.  Eyes:  Positive for blurred vision.        History of blurry vision. Currently no symptoms  Respiratory: Negative for shortness of breath.   Gastrointestinal: Positive for nausea. Negative for vomiting, abdominal pain, diarrhea and constipation.  Genitourinary: Negative for dysuria.  Musculoskeletal: Negative for back pain.  Neurological: Negative for headaches.  Physical Exam      Blood pressure 139/82, pulse 97, temperature 98.8 F (37.1 C), temperature source Oral, resp. rate 20.   Physical Exam  Nursing note reviewed.  Filed Vitals:   11/07/12 1128 11/07/12 1200 11/07/12 1258 11/07/12 1513  BP: 145/92 139/82 146/83 139/84  Pulse: 103 97 98 103  Temp: 98.8 F (37.1 C)     TempSrc: Oral     Resp: 20   20    Constitutional: She is oriented to person, place, and time. She appears well-developed and well-nourished.  Cardiovascular:  Regular rhythm and normal heart sounds.  Tachycardia present.    No murmur heard. Respiratory: Effort normal and breath sounds normal.  GI: There is no tenderness.  Musculoskeletal: Normal range of motion. She exhibits edema - 1+bilat pedal. She exhibits no tenderness.  Trace right ankle edema  Neurological: She is alert and oriented to person, place, and time. She has normal reflexes.  Skin: Skin is warm and dry. No erythema.     MAU Course    Results for orders placed during the hospital encounter of 11/07/12 (from the past 24 hour(s))  URINALYSIS, ROUTINE W REFLEX MICROSCOPIC     Status: Abnormal   Collection Time    11/07/12  1:50 PM      Result Value Range   Color, Urine YELLOW  YELLOW   APPearance CLEAR  CLEAR   Specific Gravity, Urine <1.005 (*) 1.005 - 1.030   pH 6.5  5.0 - 8.0   Glucose, UA NEGATIVE  NEGATIVE mg/dL   Hgb urine dipstick NEGATIVE  NEGATIVE   Bilirubin Urine NEGATIVE  NEGATIVE   Ketones, ur 15 (*) NEGATIVE mg/dL   Protein, ur NEGATIVE  NEGATIVE mg/dL   Urobilinogen, UA 0.2  0.0 - 1.0 mg/dL   Nitrite NEGATIVE  NEGATIVE    Leukocytes, UA NEGATIVE  NEGATIVE  PROTEIN / CREATININE RATIO, URINE     Status: Abnormal   Collection Time    11/07/12  1:50 PM      Result Value Range   Creatinine, Urine 20.80     Total Protein, Urine 5.1     PROTEIN CREATININE RATIO 0.25 (*) 0.00 - 0.15  CBC     Status: Abnormal   Collection Time    11/07/12  3:05 PM      Result Value Range   WBC 9.0  4.0 - 10.5 K/uL   RBC 3.54 (*) 3.87 - 5.11 MIL/uL   Hemoglobin 10.1 (*) 12.0 - 15.0 g/dL   HCT 16.1 (*) 09.6 - 04.5 %   MCV 85.6  78.0 - 100.0 fL   MCH 28.5  26.0 - 34.0 pg   MCHC 33.3  30.0 - 36.0 g/dL   RDW 40.9  81.1 - 91.4 %   Platelets 190  150 - 400 K/uL  COMPREHENSIVE METABOLIC PANEL     Status: Abnormal   Collection Time    11/07/12  3:05 PM      Result Value Range   Sodium 135  135 - 145 mEq/L   Potassium 3.6  3.5 - 5.1 mEq/L   Chloride 101  96 - 112 mEq/L   CO2 21  19 - 32 mEq/L   Glucose, Bld 162 (*) 70 - 99 mg/dL   BUN 5 (*) 6 - 23 mg/dL   Creatinine, Ser 7.82  0.50 - 1.10 mg/dL   Calcium 9.4  8.4 - 95.6 mg/dL   Total Protein 5.9 (*) 6.0 - 8.3 g/dL   Albumin 2.5 (*) 3.5 - 5.2 g/dL   AST 14  0 - 37 U/L   ALT 10  0 - 35 U/L   Alkaline Phosphatase 96  39 - 117 U/L   Total Bilirubin 0.2 (*) 0.3 - 1.2 mg/dL   GFR calc non Af Amer >90  >90 mL/min   GFR calc Af Amer >90  >90 mL/min    MDM - Watch for 1 hour on fetal monitoring for decelerations > no decelerations  Consulted with Dr. Marice Potter > Reviewed HPI/Exam/labs > continue follow-up next week  Assessment  and Plan    37 yo G1 at [redacted]w[redacted]d with A1 GDM s/p non-reactive NST and 8/8 BPP.  Elevated Blood Pressure - normal labs  Plan: Discharge home PreX Precautions Keep scheduled appt for NST.  [facility] 11/07/12

## 2012-11-07 NOTE — Progress Notes (Signed)
Please obtain blood pressure at next appt for NST

## 2012-11-11 ENCOUNTER — Ambulatory Visit (INDEPENDENT_AMBULATORY_CARE_PROVIDER_SITE_OTHER): Payer: Medicaid Other | Admitting: *Deleted

## 2012-11-11 VITALS — BP 139/87

## 2012-11-11 DIAGNOSIS — O163 Unspecified maternal hypertension, third trimester: Secondary | ICD-10-CM

## 2012-11-11 DIAGNOSIS — O139 Gestational [pregnancy-induced] hypertension without significant proteinuria, unspecified trimester: Secondary | ICD-10-CM

## 2012-11-11 DIAGNOSIS — O99810 Abnormal glucose complicating pregnancy: Secondary | ICD-10-CM

## 2012-11-11 NOTE — Progress Notes (Signed)
NST reviewed and reactive.  Daryll Spisak L. Harraway-Smith, M.D., FACOG    

## 2012-11-11 NOTE — Progress Notes (Signed)
P = 107 

## 2012-11-12 ENCOUNTER — Encounter: Payer: Self-pay | Admitting: *Deleted

## 2012-11-14 ENCOUNTER — Ambulatory Visit (INDEPENDENT_AMBULATORY_CARE_PROVIDER_SITE_OTHER): Payer: Medicaid Other | Admitting: *Deleted

## 2012-11-14 VITALS — BP 133/83

## 2012-11-14 DIAGNOSIS — O139 Gestational [pregnancy-induced] hypertension without significant proteinuria, unspecified trimester: Secondary | ICD-10-CM

## 2012-11-14 DIAGNOSIS — O99810 Abnormal glucose complicating pregnancy: Secondary | ICD-10-CM

## 2012-11-14 DIAGNOSIS — O163 Unspecified maternal hypertension, third trimester: Secondary | ICD-10-CM

## 2012-11-14 NOTE — Progress Notes (Signed)
NST performed today was reviewed and was found to be reactive.  AFI normal at 12.9 cm. Continue recommended antenatal testing and prenatal care.

## 2012-11-14 NOTE — Progress Notes (Signed)
P-103 

## 2012-11-17 ENCOUNTER — Ambulatory Visit (INDEPENDENT_AMBULATORY_CARE_PROVIDER_SITE_OTHER): Payer: Medicaid Other | Admitting: Obstetrics & Gynecology

## 2012-11-17 VITALS — BP 135/83 | Wt 199.5 lb

## 2012-11-17 DIAGNOSIS — O163 Unspecified maternal hypertension, third trimester: Secondary | ICD-10-CM

## 2012-11-17 DIAGNOSIS — R82998 Other abnormal findings in urine: Secondary | ICD-10-CM

## 2012-11-17 DIAGNOSIS — R8290 Unspecified abnormal findings in urine: Secondary | ICD-10-CM

## 2012-11-17 DIAGNOSIS — O99810 Abnormal glucose complicating pregnancy: Secondary | ICD-10-CM

## 2012-11-17 DIAGNOSIS — O139 Gestational [pregnancy-induced] hypertension without significant proteinuria, unspecified trimester: Secondary | ICD-10-CM

## 2012-11-17 LAB — POCT URINALYSIS DIP (DEVICE)
Bilirubin Urine: NEGATIVE
Hgb urine dipstick: NEGATIVE
Ketones, ur: NEGATIVE mg/dL
Protein, ur: NEGATIVE mg/dL
Specific Gravity, Urine: 1.015 (ref 1.005–1.030)
pH: 7 (ref 5.0–8.0)

## 2012-11-17 NOTE — Progress Notes (Signed)
U/S scheduled 11/20/12 at 830 am.

## 2012-11-17 NOTE — Progress Notes (Signed)
Fasting BG in the 90s, two values above 100. Patient had normal fasting values in previous weeks.  She says she occasionally drinks juice in the morning prior to checking BG.  Recommended water instead. Will reevaluate next week. If fastings still elevated, consider Glyburide qhs.  Will check growth scan on 11/20/12. NST performed today was reviewed and was found to be reactive.  Continue recommended antenatal testing and prenatal care. Counseled about Tdap, she will think about this and decide soon.  UA showed + nitrites, otherwise normal.  Patient has no symptoms, will follow up urine culture.  No other complaints or concerns.  Preeclampsia, fetal movement and labor precautions reviewed.

## 2012-11-17 NOTE — Progress Notes (Signed)
P = 95   Pt denies H/A or visual disturbances.  Pt is not aware of UC's during NST

## 2012-11-17 NOTE — Patient Instructions (Signed)
Tetanus, Diphtheria (Td) or Tetanus, Diphtheria, Pertussis (Tdap) Vaccine What You Need to Know WHY GET VACCINATED? Tetanus, diphtheria and pertussis can be very serious diseases. TETANUS (Lockjaw) causes painful muscle spasms and stiffness, usually all over the body.  Tetanus can lead to tightening of muscles in the head and neck so the victim cannot open his mouth or swallow, or sometimes even breathe. Tetanus kills about 1 out of 5 people who are infected. DIPHTHERIA can cause a thick membrane to cover the back of the throat.  Diphtheria can lead to breathing problems, paralysis, heart failure, and even death. PERTUSSIS (Whooping Cough) causes severe coughing spells which can lead to difficulty breathing, vomiting, and disturbed sleep.  Pertussis can lead to weight loss, incontinence, rib fractures and passing out from violent coughing. Up to 2 in 100 adolescents and 5 in 100 adults with pertussis are hospitalized or have complications, including pneumonia and death. These 3 diseases are all caused by bacteria. Diphtheria and pertussis are spread from person to person. Tetanus enters the body through cuts, scratches, or wounds. The [place] saw as many as 200,000 cases a year of diphtheria and pertussis before vaccines were available, and hundreds of cases of tetanus. Since then, tetanus and diphtheria cases have dropped by about 99% and pertussis cases by about 92%. Children 6 years of age and younger get DTaP vaccine to protect them from these three diseases. But older children, adolescents, and adults need protection too. VACCINES FOR ADOLESCENTS AND ADULTS: TD AND TDAP Two vaccines are available to protect people 7 years of age and older from these diseases:  Td vaccine has been used for many years. It protects against tetanus and diphtheria.  Tdap vaccine was licensed in 2005. It is the first vaccine for adolescents and adults that protects against pertussis as well as tetanus and  diphtheria. A Td booster dose is recommended every 10 years. Tdap is given only once. WHICH VACCINE, AND WHEN? Ages 7 through 18 years  A dose of Tdap is recommended at age 11 or 12. This dose could be given as early as age 7 for children who missed one or more childhood doses of DTaP.  Children and adolescents who did not get a complete series of DTaP shots by age 7 should complete the series using a combination of Td and Tdap. Age 19 years and Older  All adults should get a booster dose of Td every 10 years. Adults under 65 who have never gotten Tdap should get a dose of Tdap as their next booster dose. Adults 65 and older may get one booster dose of Tdap.  Adults (including women who may become pregnant and adults 65 and older) who expect to have close contact with a baby younger than 12 months of age should get a dose of Tdap to help protect the baby from pertussis.  Healthcare professionals who have direct patient contact in hospitals or clinics should get one dose of Tdap. Protection After a Wound  A person who gets a severe cut or burn might need a dose of Td or Tdap to prevent tetanus infection. Tdap should be used for anyone who has never had a dose previously. Td should be used if Tdap is not available, or for:  Anybody who has already had a dose of Tdap.  Children 7 through 9 years of age who completed the childhood DTaP series.  Adults 65 and older. Pregnant Women  Pregnant women who have never had a dose of Tdap   should get one, after the 20th week of gestation and preferably during the 3rd trimester. If they do not get Tdap during their pregnancy they should get a dose as soon as possible after delivery. Pregnant women who have previously received Tdap and need tetanus or diphtheria vaccine while pregnant should get Td. Tdap and Td may be given at the same time as other vaccines. SOME PEOPLE SHOULD NOT BE VACCINATED OR SHOULD WAIT  Anyone who has had a life-threatening  allergic reaction after a dose of any tetanus, diphtheria, or pertussis containing vaccine should not get Td or Tdap.  Anyone who has a severe allergy to any component of a vaccine should not get that vaccine. Tell your doctor if the person getting the vaccine has any severe allergies.  Anyone who had a coma, or long or multiple seizures within 7 days after a dose of DTP or DTaP should not get Tdap, unless a cause other than the vaccine was found. These people may get Td.  Talk to your doctor if the person getting either vaccine:  Has epilepsy or another nervous system problem.  Had severe swelling or severe pain after a previous dose of DTP, DTaP, DT, Td, or Tdap vaccine.  Has had Guillain Barr Syndrome (GBS). Anyone who has a moderate or severe illness on the day the shot is scheduled should usually wait until they recover before getting Tdap or Td vaccine. A person with a mild illness or low fever can usually be vaccinated. WHAT ARE THE RISKS FROM TDAP AND TD VACCINES? With a vaccine, as with any medicine, there is always a small risk of a life-threatening allergic reaction or other serious problem. Brief fainting spells and related symptoms (such as jerking movements) can happen after any medical procedure, including vaccination. Sitting or lying down for about 15 minutes after a vaccination can help prevent fainting and injuries caused by falls. Tell your doctor if the patient feels dizzy or lightheaded, or has vision changes or ringing in the ears. Getting tetanus, diphtheria, or pertussis would be much more likely to lead to severe problems than getting either Td or Tdap vaccine. Problems reported after Td and Tdap vaccines are listed below. Mild Problems (noticeable, but did not interfere with activities) Tdap  Pain (about 3 in 4 adolescents and 2 in 3 adults).  Redness or swelling (about 1 in 5).  Mild fever of at least 100.4 F (38 C) (up to about 1 in 25 adolescents and 1 in  100 adults).  Headache (about 4 in 10 adolescents and 3 in 10 adults).  Tiredness (about 1 in 3 adolescents and 1 in 4 adults).  Nausea, vomiting, diarrhea, or stomach ache (up to 1 in 4 adolescents and 1 in 10 adults).  Chills, body aches, sore joints, rash, or swollen glands (uncommon). Td  Pain (up to about 8 in 10).  Redness or swelling at the injection site (up to about 1 in 3).  Mild fever (up to about 1 in 15).  Headache or tiredness (uncommon). Moderate Problems (interfered with activities, but did not require medical attention) Tdap  Pain at the injection site (about 1 in 20 adolescents and 1 in 100 adults).  Redness or swelling at the injection site (up to about 1 in 16 adolescents and 1 in 25 adults).  Fever over 102 F (38.9 C) (about 1 in 100 adolescents and 1 in 250 adults).  Headache (1 in 300).  Nausea, vomiting, diarrhea, or stomach ache (up to 3   in 100 adolescents and 1 in 100 adults). Td  Fever over 102 F (38.9 C) (rare). Tdap or Td  Extensive swelling of the arm where the shot was given (up to about 3 in 100). Severe Problems (Unable to perform usual activities; required medical attention) Tdap or Td  Swelling, severe pain, bleeding, and redness in the arm where the shot was given (rare). A severe allergic reaction could occur after any vaccine. They are estimated to occur less than once in a million doses. WHAT IF THERE IS A SEVERE REACTION? What should I look for? Any unusual condition, such as a severe allergic reaction or a high fever. If a severe allergic reaction occurred, it would be within a few minutes to an hour after the shot. Signs of a serious allergic reaction can include difficulty breathing, weakness, hoarseness or wheezing, a fast heartbeat, hives, dizziness, paleness, or swelling of the throat. What should I do?  Call a doctor, or get the person to a doctor right away.  Tell your doctor what happened, the date and time it  happened, and when the vaccination was given.  Ask your provider to report the reaction by filing a Vaccine Adverse Event Reporting System (VAERS) form. Or, you can file this report through the VAERS website at www.vaers.hhs.gov or by calling 1-800-822-7967. VAERS does not provide medical advice. THE NATIONAL VACCINE INJURY COMPENSATION PROGRAM The National Vaccine Injury Compensation Program (VICP) was created in 1986. Persons who believe they may have been injured by a vaccine can learn about the program and about filing a claim by calling 1-800-338-2382 or visiting the VICP website at www.hrsa.gov/vaccinecompensation. HOW CAN I LEARN MORE?  Your doctor can give you the vaccine package insert or suggest other sources of information.  Call your local or state health department.  Contact the Centers for Disease Control and Prevention (CDC):  Call 1-800-232-4636 (1-800-CDC-INFO).  Visit the CDC website at www.cdc.gov/vaccines. CDC Td and Tdap Interim VIS (04/04/10) Document Released: 12/24/2005 Document Revised: 05/21/2011 Document Reviewed: 04/04/2010 Exit[employer] Patient Information 2014 [employer].  

## 2012-11-19 LAB — CULTURE, OB URINE: Colony Count: >=100000

## 2012-11-20 ENCOUNTER — Ambulatory Visit (INDEPENDENT_AMBULATORY_CARE_PROVIDER_SITE_OTHER): Payer: Medicaid Other | Admitting: *Deleted

## 2012-11-20 ENCOUNTER — Ambulatory Visit (HOSPITAL_COMMUNITY)
Admission: RE | Admit: 2012-11-20 | Discharge: 2012-11-20 | Disposition: A | Discharge status: Home / Self Care | Payer: Medicaid Other | Source: Ambulatory Visit | Attending: Obstetrics & Gynecology | Admitting: Obstetrics & Gynecology

## 2012-11-20 ENCOUNTER — Ambulatory Visit (HOSPITAL_COMMUNITY): Admission: RE | Admit: 2012-11-20 | Payer: Medicaid Other | Source: Ambulatory Visit

## 2012-11-20 VITALS — BP 148/76 | Wt 201.0 lb

## 2012-11-20 DIAGNOSIS — O139 Gestational [pregnancy-induced] hypertension without significant proteinuria, unspecified trimester: Secondary | ICD-10-CM

## 2012-11-20 DIAGNOSIS — O99810 Abnormal glucose complicating pregnancy: Secondary | ICD-10-CM

## 2012-11-20 DIAGNOSIS — O163 Unspecified maternal hypertension, third trimester: Secondary | ICD-10-CM

## 2012-11-20 DIAGNOSIS — O09519 Supervision of elderly primigravida, unspecified trimester: Secondary | ICD-10-CM | POA: Insufficient documentation

## 2012-11-20 MED ORDER — PRENATAL MULTIVITAMIN CH: 1.0000 | ORAL_TABLET | Freq: Every day | ORAL | Status: DC

## 2012-11-20 NOTE — Progress Notes (Signed)
P=99 

## 2012-11-20 NOTE — Progress Notes (Signed)
CBG reviewed, all within limits, continue diabetic diet for now.  NST performed today was reviewed and was found to be reactive.  Continue recommended antenatal testing and prenatal care.

## 2012-11-20 NOTE — Progress Notes (Signed)
P=92, Here for nst, cbg log book taken to Dr. Macon Large for review, no changes made to plan of care.

## 2012-11-24 ENCOUNTER — Ambulatory Visit (INDEPENDENT_AMBULATORY_CARE_PROVIDER_SITE_OTHER): Payer: Medicaid Other | Admitting: Obstetrics & Gynecology

## 2012-11-24 VITALS — BP 128/71 | Wt 199.8 lb

## 2012-11-24 DIAGNOSIS — O139 Gestational [pregnancy-induced] hypertension without significant proteinuria, unspecified trimester: Secondary | ICD-10-CM

## 2012-11-24 DIAGNOSIS — Z23 Encounter for immunization: Secondary | ICD-10-CM

## 2012-11-24 DIAGNOSIS — O99810 Abnormal glucose complicating pregnancy: Secondary | ICD-10-CM

## 2012-11-24 DIAGNOSIS — O163 Unspecified maternal hypertension, third trimester: Secondary | ICD-10-CM

## 2012-11-24 LAB — POCT URINALYSIS DIP (DEVICE)
Glucose, UA: NEGATIVE mg/dL
Hgb urine dipstick: NEGATIVE
Leukocytes, UA: NEGATIVE
Nitrite: NEGATIVE
Urobilinogen, UA: 0.2 mg/dL (ref 0.0–1.0)

## 2012-11-24 MED ORDER — TETANUS-DIPHTH-ACELL PERTUSSIS 5-2.5-18.5 LF-MCG/0.5 IM SUSP
0.5000 mL | Freq: Once | INTRAMUSCULAR | Status: AC
  Administered 2012-11-24: 0.5 mL via INTRAMUSCULAR

## 2012-11-24 NOTE — Progress Notes (Signed)
Fastings 83-90; all pp are <120.  NST reactive.  Pt wants Tdap today.  Urine pending at time of visit. 67% growth and fi = 20 on 11/21/12.

## 2012-11-24 NOTE — Progress Notes (Signed)
P =  96       [place] for growth done 9/11 - EFW 67%, AFI = 20.3

## 2012-11-26 ENCOUNTER — Encounter: Payer: Self-pay | Admitting: *Deleted

## 2012-11-27 ENCOUNTER — Ambulatory Visit (INDEPENDENT_AMBULATORY_CARE_PROVIDER_SITE_OTHER): Payer: Medicaid Other | Admitting: *Deleted

## 2012-11-27 VITALS — BP 134/87

## 2012-11-27 DIAGNOSIS — O139 Gestational [pregnancy-induced] hypertension without significant proteinuria, unspecified trimester: Secondary | ICD-10-CM

## 2012-11-27 DIAGNOSIS — O163 Unspecified maternal hypertension, third trimester: Secondary | ICD-10-CM

## 2012-11-27 DIAGNOSIS — O99810 Abnormal glucose complicating pregnancy: Secondary | ICD-10-CM

## 2012-11-27 LAB — POCT URINALYSIS DIP (DEVICE)
Bilirubin Urine: NEGATIVE
Leukocytes, UA: NEGATIVE
Nitrite: NEGATIVE
pH: 7 (ref 5.0–8.0)

## 2012-11-27 NOTE — Progress Notes (Signed)
P-89 

## 2012-12-01 ENCOUNTER — Ambulatory Visit (INDEPENDENT_AMBULATORY_CARE_PROVIDER_SITE_OTHER): Payer: Medicaid Other | Admitting: Obstetrics & Gynecology

## 2012-12-01 VITALS — BP 133/83 | Wt 202.9 lb

## 2012-12-01 DIAGNOSIS — O163 Unspecified maternal hypertension, third trimester: Secondary | ICD-10-CM

## 2012-12-01 DIAGNOSIS — O139 Gestational [pregnancy-induced] hypertension without significant proteinuria, unspecified trimester: Secondary | ICD-10-CM

## 2012-12-01 DIAGNOSIS — O99810 Abnormal glucose complicating pregnancy: Secondary | ICD-10-CM

## 2012-12-01 LAB — POCT URINALYSIS DIP (DEVICE)
Glucose, UA: NEGATIVE mg/dL
Ketones, ur: NEGATIVE mg/dL
Leukocytes, UA: NEGATIVE
Protein, ur: NEGATIVE mg/dL
Urobilinogen, UA: 0.2 mg/dL (ref 0.0–1.0)

## 2012-12-01 NOTE — Progress Notes (Signed)
UA showed nitrites, no blood, no protein, no LE.  Will send for urine culture and manage accordingly.  BS are within limits, continue diet control.  BP normal, no preeclampsia symptoms. Pelvic cultures done today.  NST performed today was reviewed and was found to be reactive.  Continue recommended antenatal testing and prenatal care.  No other complaints or concerns.  Preeclampsia,fetal movement and labor precautions reviewed.

## 2012-12-01 NOTE — Progress Notes (Signed)
P-90 

## 2012-12-01 NOTE — Patient Instructions (Addendum)
Return to clinic for any obstetric concerns or go to MAU for evaluation  

## 2012-12-02 LAB — CULTURE, OB URINE: Colony Count: >=100000

## 2012-12-04 ENCOUNTER — Encounter: Payer: Self-pay | Admitting: Obstetrics & Gynecology

## 2012-12-05 ENCOUNTER — Ambulatory Visit (INDEPENDENT_AMBULATORY_CARE_PROVIDER_SITE_OTHER): Payer: Medicaid Other | Admitting: *Deleted

## 2012-12-05 VITALS — BP 142/88

## 2012-12-05 DIAGNOSIS — O139 Gestational [pregnancy-induced] hypertension without significant proteinuria, unspecified trimester: Secondary | ICD-10-CM

## 2012-12-05 DIAGNOSIS — O163 Unspecified maternal hypertension, third trimester: Secondary | ICD-10-CM

## 2012-12-05 DIAGNOSIS — O99810 Abnormal glucose complicating pregnancy: Secondary | ICD-10-CM

## 2012-12-05 NOTE — Progress Notes (Signed)
NST reviewed and reactive.  Jonathin Heinicke L. Harraway-Smith, M.D., FACOG    

## 2012-12-05 NOTE — Progress Notes (Signed)
P = 84   Pt denies H/A or visual disturbances.  [place] for growth scheduled 10/2 @ 1515

## 2012-12-08 ENCOUNTER — Ambulatory Visit (INDEPENDENT_AMBULATORY_CARE_PROVIDER_SITE_OTHER): Payer: Medicaid Other | Admitting: Obstetrics and Gynecology

## 2012-12-08 ENCOUNTER — Encounter: Payer: Self-pay | Admitting: Obstetrics and Gynecology

## 2012-12-08 VITALS — BP 133/78 | Wt 203.4 lb

## 2012-12-08 DIAGNOSIS — O163 Unspecified maternal hypertension, third trimester: Secondary | ICD-10-CM

## 2012-12-08 DIAGNOSIS — O139 Gestational [pregnancy-induced] hypertension without significant proteinuria, unspecified trimester: Secondary | ICD-10-CM

## 2012-12-08 DIAGNOSIS — O99810 Abnormal glucose complicating pregnancy: Secondary | ICD-10-CM

## 2012-12-08 LAB — POCT URINALYSIS DIP (DEVICE)
Bilirubin Urine: NEGATIVE
Glucose, UA: NEGATIVE mg/dL
Hgb urine dipstick: NEGATIVE
Specific Gravity, Urine: 1.015 (ref 1.005–1.030)

## 2012-12-08 NOTE — Progress Notes (Signed)
P = 92   Pt denies H/A or visual disturbances.  [place] for growth scheduled 10/2

## 2012-12-08 NOTE — Progress Notes (Signed)
Patient doing well without complaints. FM/labor precautions reviewed. Patient did not bring CBG log but reports all values within normal range- she couldn't give me any numbers. F/U growth ultrasound on 10/2 NST reviewed and reactive

## 2012-12-11 ENCOUNTER — Ambulatory Visit (HOSPITAL_COMMUNITY)
Admission: RE | Admit: 2012-12-11 | Discharge: 2012-12-11 | Disposition: A | Discharge status: Home / Self Care | Payer: Medicaid Other | Source: Ambulatory Visit | Attending: Obstetrics & Gynecology | Admitting: Obstetrics & Gynecology

## 2012-12-11 ENCOUNTER — Ambulatory Visit (INDEPENDENT_AMBULATORY_CARE_PROVIDER_SITE_OTHER): Payer: Medicaid Other | Admitting: *Deleted

## 2012-12-11 ENCOUNTER — Ambulatory Visit (HOSPITAL_COMMUNITY): Admission: RE | Admit: 2012-12-11 | Payer: Medicaid Other | Source: Ambulatory Visit

## 2012-12-11 ENCOUNTER — Other Ambulatory Visit: Payer: Medicaid Other

## 2012-12-11 VITALS — BP 145/93

## 2012-12-11 DIAGNOSIS — O09519 Supervision of elderly primigravida, unspecified trimester: Secondary | ICD-10-CM | POA: Insufficient documentation

## 2012-12-11 DIAGNOSIS — O139 Gestational [pregnancy-induced] hypertension without significant proteinuria, unspecified trimester: Secondary | ICD-10-CM

## 2012-12-11 DIAGNOSIS — O99810 Abnormal glucose complicating pregnancy: Secondary | ICD-10-CM | POA: Insufficient documentation

## 2012-12-11 DIAGNOSIS — O163 Unspecified maternal hypertension, third trimester: Secondary | ICD-10-CM

## 2012-12-11 LAB — COMPREHENSIVE METABOLIC PANEL
BUN: 5 mg/dL — ABNORMAL LOW (ref 6–23)
CO2: 20 mEq/L (ref 19–32)
Creat: 0.59 mg/dL (ref 0.50–1.10)
Glucose, Bld: 76 mg/dL (ref 70–99)
Total Bilirubin: 0.3 mg/dL (ref 0.3–1.2)
Total Protein: 5.7 g/dL — ABNORMAL LOW (ref 6.0–8.3)

## 2012-12-11 LAB — CBC
HCT: 34 % — ABNORMAL LOW (ref 36.0–46.0)
Hemoglobin: 11.5 g/dL — ABNORMAL LOW (ref 12.0–15.0)
MCH: 27.8 pg (ref 26.0–34.0)
MCV: 82.1 fL (ref 78.0–100.0)
RBC: 4.14 MIL/uL (ref 3.87–5.11)

## 2012-12-11 NOTE — Progress Notes (Signed)
P = 91   Pt denies H/A or visual disturbances.   Consult w/Dr. Debroah Loop- PIH labs and urine protein/creatinine ratio done.  [place] for growth today

## 2012-12-12 ENCOUNTER — Ambulatory Visit (HOSPITAL_COMMUNITY): Payer: Medicaid Other

## 2012-12-12 ENCOUNTER — Encounter: Payer: Self-pay | Admitting: *Deleted

## 2012-12-12 LAB — PROTEIN / CREATININE RATIO, URINE
Creatinine, Urine: 61.4 mg/dL
Total Protein, Urine: 4 mg/dL

## 2012-12-12 NOTE — Progress Notes (Signed)
PIH lab results reviewed from yesterday - all wnl. Notice sent to Dr. Macon Large. Pt was advised yesterday that she would be contacted for any abnormality- otherwise keep next clinic appt as scheduled on 10/6.  PIH sx were reviewed and pt was instructed to return to hospital in the event of any sx.

## 2012-12-15 ENCOUNTER — Encounter: Payer: Self-pay | Admitting: Family Medicine

## 2012-12-15 ENCOUNTER — Ambulatory Visit (INDEPENDENT_AMBULATORY_CARE_PROVIDER_SITE_OTHER): Payer: Medicaid Other | Admitting: Family Medicine

## 2012-12-15 VITALS — BP 147/96 | Temp 98.3°F | Wt 201.3 lb

## 2012-12-15 DIAGNOSIS — O139 Gestational [pregnancy-induced] hypertension without significant proteinuria, unspecified trimester: Secondary | ICD-10-CM

## 2012-12-15 DIAGNOSIS — O163 Unspecified maternal hypertension, third trimester: Secondary | ICD-10-CM

## 2012-12-15 DIAGNOSIS — O99810 Abnormal glucose complicating pregnancy: Secondary | ICD-10-CM

## 2012-12-15 LAB — POCT URINALYSIS DIP (DEVICE)
Bilirubin Urine: NEGATIVE
Glucose, UA: NEGATIVE mg/dL
Hgb urine dipstick: NEGATIVE
Ketones, ur: NEGATIVE mg/dL
Leukocytes, UA: NEGATIVE
Nitrite: POSITIVE — AB
Specific Gravity, Urine: 1.015 (ref 1.005–1.030)

## 2012-12-15 NOTE — Patient Instructions (Addendum)
Breastfeeding A change in hormones during your pregnancy causes growth of your breast tissue and an increase in number and size of milk ducts. The hormone prolactin allows proteins, sugars, and fats from your blood supply to make breast milk in your milk-producing glands. The hormone progesterone prevents breast milk from being released before the birth of your baby. After the birth of your baby, your progesterone level decreases allowing breast milk to be released. Thoughts of your baby, as well as his or her sucking or crying, can stimulate the release of milk from the milk-producing glands. Deciding to breastfeed (nurse) is one of the best choices you can make for you and your baby. The information that follows gives a brief review of the benefits, as well as other important skills to know about breastfeeding. BENEFITS OF BREASTFEEDING For your baby  The first milk (colostrum) helps your baby's digestive system function better.   There are antibodies in your milk that help your baby fight off infections.   Your baby has a lower incidence of asthma, allergies, and sudden infant death syndrome (SIDS).   The nutrients in breast milk are better for your baby than infant formulas.  Breast milk improves your baby's brain development.   Your baby will have less gas, colic, and constipation.  Your baby is less likely to develop other conditions, such as childhood obesity, asthma, or diabetes mellitus. For you  Breastfeeding helps develop a very special bond between you and your baby.   Breastfeeding is convenient, always available at the correct temperature, and costs nothing.   Breastfeeding helps to burn calories and helps you lose the weight gained during pregnancy.   Breastfeeding makes your uterus contract back down to normal size faster and slows bleeding following delivery.   Breastfeeding mothers have a lower risk of developing osteoporosis or breast or ovarian cancer later  in life.  BREASTFEEDING FREQUENCY  A healthy, full-term baby may breastfeed as often as every hour or space his or her feedings to every 3 hours. Breastfeeding frequency will vary from baby to baby.   Newborns should be fed no less than every 2 3 hours during the day and every 4 5 hours during the night. You should breastfeed a minimum of 8 feedings in a 24 hour period.  Awaken your baby to breastfeed if it has been 3 4 hours since the last feeding.  Breastfeed when you feel the need to reduce the fullness of your breasts or when your newborn shows signs of hunger. Signs that your baby may be hungry include:  Increased alertness or activity.  Stretching.  Movement of the head from side to side.  Movement of the head and opening of the mouth when the corner of the mouth or cheek is stroked (rooting).  Increased sucking sounds, smacking lips, cooing, sighing, or squeaking.  Hand-to-mouth movements.  Increased sucking of fingers or hands.  Fussing.  Intermittent crying.  Signs of extreme hunger will require calming and consoling before you try to feed your baby. Signs of extreme hunger may include:  Restlessness.  A loud, strong cry.  Screaming.  Frequent feeding will help you make more milk and will help prevent problems, such as sore nipples and engorgement of the breasts.  BREASTFEEDING   Whether lying down or sitting, be sure that the baby's abdomen is facing your abdomen.   Support your breast with 4 fingers under your breast and your thumb above your nipple. Make sure your fingers are well away from   your nipple and your baby's mouth.   Stroke your baby's lips gently with your finger or nipple.   When your baby's mouth is open wide enough, place all of your nipple and as much of the colored area around your nipple (areola) as possible into your baby's mouth.  More areola should be visible above his or her upper lip than below his or her lower lip.  Your  baby's tongue should be between his or her lower gum and your breast.  Ensure that your baby's mouth is correctly positioned around the nipple (latched). Your baby's lips should create a seal on your breast.  Signs that your baby has effectively latched onto your nipple include:  Tugging or sucking without pain.  Swallowing heard between sucks.  Absent click or smacking sound.  Muscle movement above and in front of his or her ears with sucking.  Your baby must suck about 2 3 minutes in order to get your milk. Allow your baby to feed on each breast as long as he or she wants. Nurse your baby until he or she unlatches or falls asleep at the first breast, then offer the second breast.  Signs that your baby is full and satisfied include:  A gradual decrease in the number of sucks or complete cessation of sucking.  Falling asleep.  Extension or relaxation of his or her body.  Retention of a small amount of milk in his or her mouth.  Letting go of your breast by himself or herself.  Signs of effective breastfeeding in you include:  Breasts that have increased firmness, weight, and size prior to feeding.  Breasts that are softer after nursing.  Increased milk volume, as well as a change in milk consistency and color by the 5th day of breastfeeding.  Breast fullness relieved by breastfeeding.  Nipples are not sore, cracked, or bleeding.  If needed, break the suction by putting your finger into the corner of your baby's mouth and sliding your finger between his or her gums. Then, remove your breast from his or her mouth.  It is common for babies to spit up a small amount after a feeding.  Babies often swallow air during feeding. This can make babies fussy. Burping your baby between breasts can help with this.  Vitamin D supplements are recommended for babies who get only breast milk.  Avoid using a pacifier during your baby's first 4 6 weeks.  Avoid supplemental feedings of  water, formula, or juice in place of breastfeeding. Breast milk is all the food your baby needs. It is not necessary for your baby to have water or formula. Your breasts will make more milk if supplemental feedings are avoided during the early weeks. HOW TO TELL WHETHER YOUR BABY IS GETTING ENOUGH BREAST MILK Wondering whether or not your baby is getting enough milk is a common concern among mothers. You can be assured that your baby is getting enough milk if:   Your baby is actively sucking and you hear swallowing.   Your baby seems relaxed and satisfied after a feeding.   Your baby nurses at least 8 12 times in a 24 hour time period.  During the first 3 5 days of age:  Your baby is wetting at least 3 5 diapers in a 24 hour period. The urine should be clear and pale yellow.  Your baby is having at least 3 4 stools in a 24 hour period. The stool should be soft and yellow.  At   5 7 days of age, your baby is having at least 3 6 stools in a 24 hour period. The stool should be seedy and yellow by 5 days of age.  Your baby has a weight loss less than 7 10% during the first 3 days of age.  Your baby does not lose weight after 3 7 days of age.  Your baby gains 4 7 ounces each week after he or she is 4 days of age.  Your baby gains weight by 5 days of age and is back to birth weight within 2 weeks. ENGORGEMENT In the first week after your baby is born, you may experience extremely full breasts (engorgement). When engorged, your breasts may feel heavy, warm, or tender to the touch. Engorgement peaks within 24 48 hours after delivery of your baby.  Engorgement may be reduced by:  Continuing to breastfeed.  Increasing the frequency of breastfeeding.  Taking warm showers or applying warm, moist heat to your breasts just before each feeding. This increases circulation and helps the milk flow.   Gently massaging your breast before and during the feedings. With your fingertips, massage from  your chest wall towards your nipple in a circular motion.   Ensuring that your baby empties at least one breast at every feeding. It also helps to start the next feeding on the opposite breast.   Expressing breast milk by hand or by using a breast pump to empty the breasts if your baby is sleepy, or not nursing well. You may also want to express milk if you are returning to work oryou feel you are getting engorged.  Ensuring your baby is latched on and positioned properly while breastfeeding. If you follow these suggestions, your engorgement should improve in 24 48 hours. If you are still experiencing difficulty, call your lactation consultant or caregiver.  CARING FOR YOURSELF Take care of your breasts.  Bathe or shower daily.   Avoid using soap on your nipples.   Wear a supportive bra. Avoid wearing underwire style bras.  Air dry your nipples for a 3 4minutes after each feeding.   Use only cotton bra pads to absorb breast milk leakage. Leaking of breast milk between feedings is normal.   Use only pure lanolin on your nipples after nursing. You do not need to wash it off before feeding your baby again. Another option is to express a few drops of breast milk and gently massage that milk into your nipples.  Continue breast self-awareness checks. Take care of yourself.  Eat healthy foods. Alternate 3 meals with 3 snacks.  Avoid foods that you notice affect your baby in a bad way.  Drink milk, fruit juice, and water to satisfy your thirst (about 8 glasses a day).   Rest often, relax, and take your prenatal vitamins to prevent fatigue, stress, and anemia.  Avoid chewing and smoking tobacco.  Avoid alcohol and drug use.  Take over-the-counter and prescribed medicine only as directed by your caregiver or pharmacist. You should always check with your caregiver or pharmacist before taking any new medicine, vitamin, or herbal supplement.  Know that pregnancy is possible while  breastfeeding. If desired, talk to your caregiver about family planning and safe birth control methods that may be used while breastfeeding. SEEK MEDICAL CARE IF:   You feel like you want to stop breastfeeding or have become frustrated with breastfeeding.  You have painful breasts or nipples.  Your nipples are cracked or bleeding.  Your breasts are red, tender,   or warm.  You have a swollen area on either breast.  You have a fever or chills.  You have nausea or vomiting.  You have drainage from your nipples.  Your breasts do not become full before feedings by the 5th day after delivery.  You feel sad and depressed.  Your baby is too sleepy to eat well.  Your baby is having trouble sleeping.   Your baby is wetting less than 3 diapers in a 24 hour period.  Your baby has less than 3 stools in a 24 hour period.  Your baby's skin or the white part of his or her eyes becomes more yellow.   Your baby is not gaining weight by 5 days of age. MAKE SURE YOU:   Understand these instructions.  Will watch your condition.  Will get help right away if you are not doing well or get worse. Document Released: 02/26/2005 Document Revised: 11/21/2011 Document Reviewed: 10/03/2011 [employer] Patient Information 2014 [employer]. Labor Induction  Most women go into labor on their own between 37 and 42 weeks of the pregnancy. When this does not happen or when there is a medical need, medicine or other methods may be used to induce labor. Labor induction causes a pregnant woman's uterus to contract. It also causes the cervix to soften (ripen), open (dilate), and thin out (efface). Usually, labor is not induced before 39 weeks of the pregnancy unless there is a problem with the baby or mother. Whether your labor will be induced depends on a number of factors, including the following:  The medical condition of you and the baby.  How many weeks along you are.  The status of baby's lung  maturity.  The condition of the cervix.  The position of the baby. REASONS FOR LABOR INDUCTION  The health of the baby or mother is at risk.  The pregnancy is overdue by 1 week or more.  The water breaks but labor does not start on its own.  The mother has a health condition or serious illness such as high blood pressure, infection, placental abruption, or diabetes.  The amniotic fluid amounts are low around the baby.  The baby is distressed. REASONS TO NOT INDUCE LABOR Labor induction may not be a good idea if:  It is shown that your baby does not tolerate labor.  An induction is just more convenient.  You want the baby to be born on a certain date, like a holiday.  You have had previous surgeries on your uterus, such as a myomectomy or the removal of fibroids.  Your placenta lies very low in the uterus and blocks the opening of the cervix (placenta previa).  Your baby is not in a head down position.  The umbilical cord drops down into the birth canal in front of the baby. This could cut off the baby's blood and oxygen supply.  You have had a previous cesarean delivery.  There areunusual circumstances, such as the baby being extremely premature. RISKS AND COMPLICATIONS Problems may occur in the process of induction and plans may need to be modified as a situation unfolds. Some of the risks of induction include:  Change in fetal heart rate, such as too high, too low, or erratic.  Risk of fetal distress.  Risk of infection to mother and baby.  Increased chance of having a cesarean delivery.  The rare, but increased chance that the placenta will separate from the uterus (abruption).  Uterine rupture (very rare). When induction is   needed for medical reasons, the benefits of induction may outweigh the risks. BEFORE THE PROCEDURE Your caregiver will check your cervix and the baby's position. This will help your caregiver decide if you are far enough along for an  induction to work. PROCEDURE Several methods of labor induction may be used, such as:   Taking prostaglandin medicine to dilate and ripen the cervix. The medicine will also start contractions. It can be taken by mouth or by inserting a suppository into the vagina.  A thin tube (catheter) with a balloon on the end may be inserted into your vagina to dilate the cervix. Once inserted, the balloon expands with water, which causes the cervix to open.  Striping the membranes. Your caregiver inserts a finger between the cervix and membranes, which causes the cervix to be stretched and may cause the uterus to contract. This is often done during an office visit. You will be sent home to wait for the contractions to begin. You will then come in for an induction.  Breaking the water. Your caregiver will make a hole in the amniotic sac using a small instrument. Once the amniotic sac breaks, contractions should begin. This may still take hours to see an effect.  Taking medicine to trigger or strengthen contractions. This medicine is given intravenously through a tube in your arm. All of the methods of induction, besides stripping the membranes, will be done in the hospital. Induction is done in the hospital so that you and the baby can be carefully monitored. AFTER THE PROCEDURE Some inductions can take up to 2 or 3 days. Depending on the cervix, it usually takes less time. It takes longer when you are induced early in the pregnancy or if this is your first pregnancy. If a mother is still pregnant and the induction has been going on for 2 to 3 days, either the mother will be sent home or a cesarean delivery will be needed. Document Released: 07/18/2006 Document Revised: 05/21/2011 Document Reviewed: 01/01/2011 [employer] Patient Information 2014 [employer].  

## 2012-12-15 NOTE — Progress Notes (Signed)
U/S 10/2 shows 7 lb 5 oz (75%), AFI 11.59, vtx NST reviewed and reactive. FBS 84-90 2hr pp 92-120 For IOL Friday

## 2012-12-15 NOTE — Progress Notes (Signed)
IOL scheduled Friday, October 10th at 730pm.(no morning  appointments available)

## 2012-12-15 NOTE — Progress Notes (Signed)
Pulse: 91

## 2012-12-16 ENCOUNTER — Inpatient Hospital Stay (HOSPITAL_COMMUNITY)
Admission: AD | Admit: 2012-12-16 | Discharge: 2012-12-16 | Disposition: A | Discharge status: Home / Self Care | Payer: Medicaid Other | Source: Ambulatory Visit | Attending: Obstetrics & Gynecology | Admitting: Obstetrics & Gynecology

## 2012-12-16 ENCOUNTER — Encounter (HOSPITAL_COMMUNITY): Payer: Self-pay | Admitting: *Deleted

## 2012-12-16 DIAGNOSIS — O2340 Unspecified infection of urinary tract in pregnancy, unspecified trimester: Secondary | ICD-10-CM

## 2012-12-16 DIAGNOSIS — N390 Urinary tract infection, site not specified: Secondary | ICD-10-CM

## 2012-12-16 DIAGNOSIS — O139 Gestational [pregnancy-induced] hypertension without significant proteinuria, unspecified trimester: Secondary | ICD-10-CM | POA: Insufficient documentation

## 2012-12-16 DIAGNOSIS — O99810 Abnormal glucose complicating pregnancy: Secondary | ICD-10-CM | POA: Insufficient documentation

## 2012-12-16 DIAGNOSIS — O479 False labor, unspecified: Secondary | ICD-10-CM | POA: Insufficient documentation

## 2012-12-16 DIAGNOSIS — O2390 Unspecified genitourinary tract infection in pregnancy, unspecified trimester: Secondary | ICD-10-CM

## 2012-12-16 LAB — COMPREHENSIVE METABOLIC PANEL
ALT: 14 U/L (ref 0–35)
Albumin: 2.6 g/dL — ABNORMAL LOW (ref 3.5–5.2)
Alkaline Phosphatase: 169 U/L — ABNORMAL HIGH (ref 39–117)
CO2: 20 mEq/L (ref 19–32)
GFR calc Af Amer: >90 mL/min (ref >90)
GFR calc non Af Amer: >90 mL/min (ref >90)
Glucose, Bld: 90 mg/dL (ref 70–99)
Potassium: 4 mEq/L (ref 3.5–5.1)
Sodium: 135 mEq/L (ref 135–145)

## 2012-12-16 LAB — CBC
Hemoglobin: 11.8 g/dL — ABNORMAL LOW (ref 12.0–15.0)
MCHC: 33.7 g/dL (ref 30.0–36.0)
MCV: 84.1 fL (ref 78.0–100.0)
RBC: 4.16 MIL/uL (ref 3.87–5.11)

## 2012-12-16 LAB — PROTEIN / CREATININE RATIO, URINE: Protein Creatinine Ratio: 0.09 (ref 0.00–0.15)

## 2012-12-16 MED ORDER — CEPHALEXIN 500 MG PO CAPS: 500.0000 mg | ORAL_CAPSULE | Freq: Two times a day (BID) | ORAL | Status: DC

## 2012-12-16 MED ORDER — HYDROXYZINE PAMOATE 50 MG PO CAPS: ORAL_CAPSULE | ORAL | Status: DC

## 2012-12-16 NOTE — MAU Note (Signed)
Patient voided on admission but did not catch urine. Up to the bathroom to obtain clean catch urine for labs.

## 2012-12-16 NOTE — MAU Note (Signed)
Patient presents with complaint of contractions 5 minutes apart since 0730 today.

## 2012-12-16 NOTE — MAU Provider Note (Signed)
History     CSN: [id number]  Arrival date and time: 12/16/12 1037   First Provider Initiated Contact with Patient 12/16/12 1145      Chief Complaint  Patient presents with  . Labor Eval   HPI Patient is a G1P0 presenting to the MAU with regular (q61min) contractions since 6:00 am.  She denies vaginal bleeding, leaking fluid, headaches, visual disturbances.  She as A1 GDM and has a history of elevated BP in the clinic. She is scheduled for induction on 10/10.  Past Medical History  Diagnosis Date  . Gestational diabetes     Past Surgical History  Procedure Laterality Date  . Laparoscopic ovarian  2004    History reviewed. No pertinent family history.  History  Substance Use Topics  . Smoking status: Never Smoker   . Smokeless tobacco: Never Used  . Alcohol Use: No    Allergies: No Known Allergies  Prescriptions prior to admission  Medication Sig Dispense Refill  . Prenatal Vit-Fe Fumarate-FA (PRENATAL MULTIVITAMIN) TABS tablet Take 1 tablet by mouth daily at 12 noon.  30 tablet  3  . ranitidine (ZANTAC) 150 MG tablet Take 150 mg by mouth daily.      Marland Kitchen ACCU-CHEK FASTCLIX LANCETS MISC 1 Units by Does not apply route 4 (four) times daily - after meals and at bedtime.  360 each  1  . glucose blood (ACCU-CHEK SMARTVIEW) test strip Use as instructed  100 each  12    Review of Systems  Constitutional: Negative.   HENT: Negative.   Eyes: Negative.  Negative for blurred vision.  Respiratory: Negative.   Cardiovascular: Negative.   Gastrointestinal: Negative.   Genitourinary: Negative.   Musculoskeletal: Negative.   Skin: Negative.   Neurological: Negative.  Negative for headaches.  Endo/Heme/Allergies: Negative.   Psychiatric/Behavioral: Negative.    Physical Exam   Blood pressure 128/82, pulse 93, temperature 98.3 F (36.8 C), temperature source Oral, resp. rate 18, height 5\' 4"  (1.626 m), weight 91.683 kg (202 lb 2 oz).  Filed Vitals:   12/16/12 1345 12/16/12  1400 12/16/12 1415 12/16/12 1430  BP: 126/84 128/81 125/73 128/82  Pulse: 89 90 92 93  Temp:      TempSrc:      Resp:      Height:      Weight:       Initial blood pressures obtained with inappropriate size cuff (160's/90's); once cuff changed to large blood pressures remained 120-130's/70-80's  Physical Exam  Constitutional: She is oriented to person, place, and time. She appears well-developed and well-nourished. No distress.  HENT:  Head: Normocephalic.  Neck: Normal range of motion. Neck supple. No thyromegaly present.  Cardiovascular: Normal rate, regular rhythm and normal heart sounds.   Respiratory: Effort normal.  GI: Soft. There is no tenderness.  Genitourinary: Uterus normal. Vaginal discharge found.  Musculoskeletal: Normal range of motion. She exhibits no edema.  Neurological: She is alert and oriented to person, place, and time.  Skin: Skin is warm and dry.  Psychiatric: She has a normal mood and affect. Her behavior is normal. Judgment and thought content normal.   Dilation: 1 Effacement (%): Thick Cervical Position: Posterior Station: Ballotable Presentation: Vertex Exam by:: Rockwell Germany, CNM  Cervix recheck > no change MAU Course  Procedures  Results for orders placed during the hospital encounter of 12/16/12 (from the past 24 hour(s))  PROTEIN / CREATININE RATIO, URINE     Status: None   Collection Time    12/16/12 12:10  PM      Result Value Range   Creatinine, Urine 117.32     Total Protein, Urine 10.5     PROTEIN CREATININE RATIO 0.09  0.00 - 0.15  CBC     Status: Abnormal   Collection Time    12/16/12 12:37 PM      Result Value Range   WBC 9.4  4.0 - 10.5 K/uL   RBC 4.16  3.87 - 5.11 MIL/uL   Hemoglobin 11.8 (*) 12.0 - 15.0 g/dL   HCT 16.1 (*) 09.6 - 04.5 %   MCV 84.1  78.0 - 100.0 fL   MCH 28.4  26.0 - 34.0 pg   MCHC 33.7  30.0 - 36.0 g/dL   RDW 40.9  81.1 - 91.4 %   Platelets 164  150 - 400 K/uL  COMPREHENSIVE METABOLIC PANEL     Status:  Abnormal   Collection Time    12/16/12 12:37 PM      Result Value Range   Sodium 135  135 - 145 mEq/L   Potassium 4.0  3.5 - 5.1 mEq/L   Chloride 104  96 - 112 mEq/L   CO2 20  19 - 32 mEq/L   Glucose, Bld 90  70 - 99 mg/dL   BUN 6  6 - 23 mg/dL   Creatinine, Ser 7.82  0.50 - 1.10 mg/dL   Calcium 9.8  8.4 - 95.6 mg/dL   Total Protein 6.0  6.0 - 8.3 g/dL   Albumin 2.6 (*) 3.5 - 5.2 g/dL   AST 18  0 - 37 U/L   ALT 14  0 - 35 U/L   Alkaline Phosphatase 169 (*) 39 - 117 U/L   Total Bilirubin 0.3  0.3 - 1.2 mg/dL   GFR calc non Af Amer >90  >90 mL/min   GFR calc Af Amer >90  >90 mL/min     Assessment and Plan  37 yo G1P0000 at [redacted]w[redacted]d wks IUP Gestational Diabetes - Diet Controlled Gestational Hypertension Braxton Hick's UTI in Pregnancy Category I FHR Tracing  Plan: Discharge to home RX Keflex 500 mg BID x 7 days Urine culture Vistaril 75 mg prn pain Keep scheduled induction of labor for Friday 10/10   [facility] 12/16/2012, 2:34 PM

## 2012-12-16 NOTE — Progress Notes (Signed)
10/2 NST reviewed and reactive

## 2012-12-16 NOTE — Progress Notes (Signed)
NST 11-28-12 reviewed and reactive

## 2012-12-16 NOTE — MAU Note (Signed)
Patient is in for labor eval. She states that she was 2cm yesterday at the doctor's office. Her ctx q41mins since 6am. She denies vaginal bleeding or lof. She reports good fetal movement.

## 2012-12-16 NOTE — MAU Note (Signed)
Barton Fanny., CNM resident informed of patient arrival; complaint; gestation; G/P; FHR; contractions; GBS negative; Increased BP's.

## 2012-12-17 ENCOUNTER — Telehealth (HOSPITAL_COMMUNITY): Payer: Self-pay | Admitting: *Deleted

## 2012-12-17 ENCOUNTER — Encounter (HOSPITAL_COMMUNITY): Payer: Self-pay | Admitting: *Deleted

## 2012-12-17 LAB — URINE CULTURE: Colony Count: >=100000

## 2012-12-17 NOTE — Telephone Encounter (Signed)
Preadmission screen  

## 2012-12-19 ENCOUNTER — Encounter (HOSPITAL_COMMUNITY): Payer: Self-pay

## 2012-12-19 ENCOUNTER — Inpatient Hospital Stay (HOSPITAL_COMMUNITY)
Admission: RE | Admit: 2012-12-19 | Discharge: 2012-12-24 | DRG: 765 | Disposition: A | Discharge status: Home / Self Care | Payer: Medicaid Other | Source: Ambulatory Visit | Attending: Obstetrics & Gynecology | Admitting: Obstetrics & Gynecology

## 2012-12-19 VITALS — BP 147/92 | HR 82 | Temp 98.1°F | Resp 20 | Ht 64.0 in | Wt 202.0 lb

## 2012-12-19 DIAGNOSIS — O163 Unspecified maternal hypertension, third trimester: Secondary | ICD-10-CM

## 2012-12-19 DIAGNOSIS — O139 Gestational [pregnancy-induced] hypertension without significant proteinuria, unspecified trimester: Secondary | ICD-10-CM | POA: Diagnosis present

## 2012-12-19 DIAGNOSIS — O09519 Supervision of elderly primigravida, unspecified trimester: Secondary | ICD-10-CM | POA: Diagnosis present

## 2012-12-19 DIAGNOSIS — O99810 Abnormal glucose complicating pregnancy: Secondary | ICD-10-CM

## 2012-12-19 DIAGNOSIS — O324XX0 Maternal care for high head at term, not applicable or unspecified: Secondary | ICD-10-CM | POA: Diagnosis present

## 2012-12-19 DIAGNOSIS — O99814 Abnormal glucose complicating childbirth: Secondary | ICD-10-CM | POA: Diagnosis present

## 2012-12-19 HISTORY — DX: Other specified health status: Z78.9

## 2012-12-19 HISTORY — DX: Supervision of pregnancy with insufficient antenatal care, unspecified trimester: O09.30

## 2012-12-19 HISTORY — DX: Gestational (pregnancy-induced) hypertension without significant proteinuria, unspecified trimester: O13.9

## 2012-12-19 HISTORY — DX: Reserved for concepts with insufficient information to code with codable children: IMO0002

## 2012-12-19 LAB — COMPREHENSIVE METABOLIC PANEL
AST: 15 U/L (ref 0–37)
Albumin: 2.7 g/dL — ABNORMAL LOW (ref 3.5–5.2)
Alkaline Phosphatase: 178 U/L — ABNORMAL HIGH (ref 39–117)
BUN: 5 mg/dL — ABNORMAL LOW (ref 6–23)
Calcium: 10 mg/dL (ref 8.4–10.5)
Potassium: 3.8 mEq/L (ref 3.5–5.1)
Sodium: 135 mEq/L (ref 135–145)
Total Protein: 6.2 g/dL (ref 6.0–8.3)

## 2012-12-19 LAB — CBC
HCT: 35.4 % — ABNORMAL LOW (ref 36.0–46.0)
MCHC: 33.6 g/dL (ref 30.0–36.0)
MCV: 84.5 fL (ref 78.0–100.0)
Platelets: 171 10*3/uL (ref 150–400)
RDW: 14.9 % (ref 11.5–15.5)
WBC: 9.6 10*3/uL (ref 4.0–10.5)

## 2012-12-19 MED ORDER — ACETAMINOPHEN 325 MG PO TABS: 650.0000 mg | ORAL_TABLET | ORAL | Status: DC | PRN

## 2012-12-19 MED ORDER — LIDOCAINE HCL (PF) 1 % IJ SOLN
30.0000 mL | INTRAMUSCULAR | Status: DC | PRN
  Filled 2012-12-19: qty 30

## 2012-12-19 MED ORDER — LACTATED RINGERS IV SOLN
500.0000 mL | INTRAVENOUS | Status: DC | PRN
  Administered 2012-12-21: 1000 mL via INTRAVENOUS

## 2012-12-19 MED ORDER — FLEET ENEMA 7-19 GM/118ML RE ENEM: 1.0000 | ENEMA | RECTAL | Status: DC | PRN

## 2012-12-19 MED ORDER — ONDANSETRON HCL 4 MG/2ML IJ SOLN: 4.0000 mg | Freq: Four times a day (QID) | INTRAMUSCULAR | Status: DC | PRN

## 2012-12-19 MED ORDER — LACTATED RINGERS IV SOLN
INTRAVENOUS | Status: DC
  Administered 2012-12-20 – 2012-12-21 (×5): via INTRAVENOUS

## 2012-12-19 MED ORDER — OXYTOCIN BOLUS FROM INFUSION: 500.0000 mL | INTRAVENOUS | Status: DC

## 2012-12-19 MED ORDER — CITRIC ACID-SODIUM CITRATE 334-500 MG/5ML PO SOLN
30.0000 mL | ORAL | Status: DC | PRN
  Administered 2012-12-20 – 2012-12-21 (×2): 30 mL via ORAL
  Filled 2012-12-19 (×3): qty 15

## 2012-12-19 MED ORDER — OXYCODONE-ACETAMINOPHEN 5-325 MG PO TABS: 1.0000 | ORAL_TABLET | ORAL | Status: DC | PRN

## 2012-12-19 MED ORDER — IBUPROFEN 600 MG PO TABS: 600.0000 mg | ORAL_TABLET | Freq: Four times a day (QID) | ORAL | Status: DC | PRN

## 2012-12-19 MED ORDER — OXYTOCIN 40 UNITS IN LACTATED RINGERS INFUSION - SIMPLE MED
62.5000 mL/h | INTRAVENOUS | Status: DC
  Filled 2012-12-19: qty 1000

## 2012-12-19 MED ORDER — FENTANYL CITRATE 0.05 MG/ML IJ SOLN
100.0000 ug | INTRAMUSCULAR | Status: DC | PRN
  Administered 2012-12-19 – 2012-12-20 (×10): 100 ug via INTRAVENOUS
  Filled 2012-12-19 (×10): qty 2

## 2012-12-19 NOTE — Progress Notes (Signed)
Catherine [name] is a 37 y.o. G1P0000 at [redacted]w[redacted]d by   Subjective: Mostly comfortable  Objective: BP 157/88  Pulse 92  Temp(Src) 98.5 F (36.9 C) (Oral)  Resp 18  Ht 5\' 4"  (1.626 m)  Wt 91.627 kg (202 lb)  BMI 34.66 kg/m2      FHT:  FHR: 150 bpm, variability: moderate,  accelerations:  Present,  decelerations:  Absent UC:   irregular, every 2-6 minutes with coupling SVE:   Dilation: 1.5 Effacement (%): 80 Station: -3 Exam by:: Theodis Sato MD- Cook foley inserted by MM without difficulty  Labs:  CBC    Component Value Date/Time   WBC 9.6 12/19/2012 2020   RBC 4.19 12/19/2012 2020   HGB 11.9* 12/19/2012 2020   HGB 10.6 10/13/2012   HCT 35.4* 12/19/2012 2020   HCT 33 10/13/2012   PLT 171 12/19/2012 2020   PLT 194 10/13/2012   MCV 84.5 12/19/2012 2020   MCH 28.4 12/19/2012 2020   MCHC 33.6 12/19/2012 2020   RDW 14.9 12/19/2012 2020   CMP     Component Value Date/Time   NA 135 12/19/2012 2020   K 3.8 12/19/2012 2020   CL 103 12/19/2012 2020   CO2 20 12/19/2012 2020   GLUCOSE 93 12/19/2012 2020   BUN 5* 12/19/2012 2020   CREATININE 0.52 12/19/2012 2020   CREATININE 0.59 12/11/2012 1600   CALCIUM 10.0 12/19/2012 2020   PROT 6.2 12/19/2012 2020   ALBUMIN 2.7* 12/19/2012 2020   AST 15 12/19/2012 2020   ALT 11 12/19/2012 2020   ALKPHOS 178* 12/19/2012 2020   BILITOT 0.2* 12/19/2012 2020   GFRNONAA >90 12/19/2012 2020   GFRAA >90 12/19/2012 2020      Assessment / Plan: IOL process GHTN- stable BP with nl bloodwork A1GDM  Will give Fentanyl due to intense cramping s/p foley Leave foley in place until it comes out P/C ratio pending   Catherine [name] 12/19/2012, 11:48 PM

## 2012-12-19 NOTE — H&P (Signed)
Catherine [name] is a 37 y.o. female G1P0000 with IUP at [redacted]w[redacted]d by [place] at 29wks presenting for IOL for gestational HTN. Pt states she has been having irregular, every 2-5 minutes contractions lasting anywhere from 20-40sec , associated with scant staining vaginal bleeding.  Membranes are intact, with active fetal movement.  Currently rates pain 9.5/10.  [facility] at [facility] since 29 wks. Pregnancy complicated by A1GDM and gestHTN.   Prenatal History/Complications:  Past Medical History: Past Medical History  Diagnosis Date  . Gestational diabetes     dier controlled  . Medical history non-contributory     Past Surgical History: Past Surgical History  Procedure Laterality Date  . Laparoscopic ovarian  2004    Obstetrical History: OB History   Grav Para Term Preterm Abortions TAB SAB Ect Mult Living   1 0 0 0 0 0 0 0 0 0      1. Current  Gynecological History: Pertinent Gynecological History: Menses: flow is moderate Nml pap 10/13/12   Social History: History   Social History  . Marital Status: Single    Spouse Name: N/A    Number of Children: N/A  . Years of Education: N/A   Social History Main Topics  . Smoking status: Never Smoker   . Smokeless tobacco: Never Used  . Alcohol Use: No  . Drug Use: No  . Sexual Activity: Not Currently   Other Topics Concern  . None   Social History Narrative  . None    Family History: Family History  Problem Relation Age of Onset  . Alcohol abuse Neg Hx   . Arthritis Neg Hx   . Asthma Neg Hx   . Birth defects Neg Hx   . Cancer Neg Hx   . COPD Neg Hx   . Depression Neg Hx   . Diabetes Neg Hx   . Drug abuse Neg Hx   . Early death Neg Hx   . Hearing loss Neg Hx   . Heart disease Neg Hx   . Hyperlipidemia Neg Hx   . Hypertension Neg Hx   . Kidney disease Neg Hx   . Learning disabilities Neg Hx   . Mental illness Neg Hx   . Mental retardation Neg Hx   . Miscarriages / Stillbirths Neg Hx   . Stroke Neg Hx   . Vision loss  Neg Hx     Allergies: No Known Allergies  Prescriptions prior to admission  Medication Sig Dispense Refill  . ACCU-CHEK FASTCLIX LANCETS MISC 1 Units by Does not apply route 4 (four) times daily - after meals and at bedtime.  360 each  1  . cephALEXin (KEFLEX) 500 MG capsule Take 1 capsule (500 mg total) by mouth 2 (two) times daily.  14 capsule  0  . glucose blood (ACCU-CHEK SMARTVIEW) test strip Use as instructed  100 each  12  . hydrOXYzine (VISTARIL) 50 MG capsule Take BID as needed for early contractions  30 capsule  0  . Prenatal Vit-Fe Fumarate-FA (PRENATAL MULTIVITAMIN) TABS tablet Take 1 tablet by mouth daily at 12 noon.  30 tablet  3  . ranitidine (ZANTAC) 150 MG tablet Take 150 mg by mouth daily.         Review of Systems   Constitutional: Negative for fever, chills, weight loss, malaise/fatigue and diaphoresis.  HENT: Negative for hearing loss, ear pain, nosebleeds, congestion, sore throat, neck pain, tinnitus and ear discharge.   Eyes: Negative for blurred vision, double vision, photophobia, pain, discharge and  redness.  Respiratory: Negative for cough, hemoptysis, sputum production, shortness of breath, wheezing and stridor.   Cardiovascular: Negative for chest pain, palpitations, orthopnea,  leg swelling  Gastrointestinal: Positive for abdominal pain. Negative for heartburn, nausea, vomiting, diarrhea, constipation, blood in stool Genitourinary: Negative for dysuria, urgency, frequency, hematuria and flank pain.  Musculoskeletal: Negative for myalgias, back pain, joint pain and falls.  Skin: Negative for itching and rash.  Neurological: Negative for dizziness, tingling, tremors, sensory change, speech change, focal weakness, seizures, loss of consciousness, weakness and headaches.  Endo/Heme/Allergies: Negative for environmental allergies and polydipsia. Does not bruise/bleed easily.  Psychiatric/Behavioral: Negative for depression, suicidal ideas, hallucinations, memory  loss and substance abuse. The patient is not nervous/anxious and does not have insomnia.       Blood pressure 163/97, pulse 107, temperature 98.5 F (36.9 C), temperature source Oral, resp. rate 20, height 5\' 4"  (1.626 m), weight 91.627 kg (202 lb). General appearance: alert, cooperative and no distress Lungs: clear to auscultation bilaterally Heart: regular rate and rhythm Abdomen: soft, non-tender; bowel sounds normal Pelvic: 1, 80, -3 Extremities: Homans sign is negative, no sign of DVT DTR's nml Presentation: cephalic Fetal monitoringBaseline: 150 bpm, Variability: Good {> 6 bpm), Accelerations: Reactive and Decelerations: Absent Uterine activity contractions every 2-4 mins    Prenatal labs: ABO, Rh: O/Positive/-- (08/04 0000) Antibody: Negative (08/04 0000) Rubella:   RPR: Nonreactive, Nonreactive (08/04 0000)  HBsAg: Negative (08/04 0000)  HIV: Non-reactive (08/04 0000)  GBS: Negative (09/22 0000)  1 hr Glucola abnl, abnl 3 hr Genetic screening  Too late Anatomy US nml  No results found for this or any previous visit (from the past 24 hour(s)). CMP     Component Value Date/Time   NA 135 12/16/2012 1237   K 4.0 12/16/2012 1237   CL 104 12/16/2012 1237   CO2 20 12/16/2012 1237   GLUCOSE 90 12/16/2012 1237   BUN 6 12/16/2012 1237   CREATININE 0.51 12/16/2012 1237   CREATININE 0.59 12/11/2012 1600   CALCIUM 9.8 12/16/2012 1237   PROT 6.0 12/16/2012 1237   ALBUMIN 2.6* 12/16/2012 1237   AST 18 12/16/2012 1237   ALT 14 12/16/2012 1237   ALKPHOS 169* 12/16/2012 1237   BILITOT 0.3 12/16/2012 1237   GFRNONAA >90 12/16/2012 1237   GFRAA >90 12/16/2012 1237    Assessment: Catherine [name] is a 37 y.o. G1P0000 with an IUP at [redacted]w[redacted]d presenting for induction of labor for gest HTN, systolics here 160s  Plan: 1. IOL for gest HTN -repeat CMET, CBC with diff, pr/cr ratio- consider mag pending test results and BP trends -routine orders -plan for epidural once pt is further along,  fentanyl prn  -hiv nr, gbs neg -nml dtrs, no ruq pain  2. GDMA1 -not on meds -cbgs q4 -> q2  3. Postpartum -plans for breast feeding, undecided abt contraception  Anselm Lis, MD 12/19/2012, 9:04 PM Evaluation and management procedures were performed by Resident physician under my supervision/collaboration. Chart reviewed, patient examined by me and I agree with management and plan.

## 2012-12-20 ENCOUNTER — Encounter (HOSPITAL_COMMUNITY): Payer: Medicaid Other | Admitting: Anesthesiology

## 2012-12-20 ENCOUNTER — Inpatient Hospital Stay (HOSPITAL_COMMUNITY): Payer: Medicaid Other | Admitting: Anesthesiology

## 2012-12-20 ENCOUNTER — Encounter (HOSPITAL_COMMUNITY): Payer: Self-pay

## 2012-12-20 LAB — CBC
Hemoglobin: 11.3 g/dL — ABNORMAL LOW (ref 12.0–15.0)
MCH: 28.5 pg (ref 26.0–34.0)
MCHC: 34 g/dL (ref 30.0–36.0)
Platelets: 150 10*3/uL (ref 150–400)
RDW: 14.7 % (ref 11.5–15.5)
WBC: 10.6 10*3/uL — ABNORMAL HIGH (ref 4.0–10.5)

## 2012-12-20 LAB — GLUCOSE, CAPILLARY
Glucose-Capillary: 101 mg/dL — ABNORMAL HIGH (ref 70–99)
Glucose-Capillary: 101 mg/dL — ABNORMAL HIGH (ref 70–99)
Glucose-Capillary: 72 mg/dL (ref 70–99)
Glucose-Capillary: 75 mg/dL (ref 70–99)
Glucose-Capillary: 85 mg/dL (ref 70–99)
Glucose-Capillary: 91 mg/dL (ref 70–99)
Glucose-Capillary: 91 mg/dL (ref 70–99)
Glucose-Capillary: 93 mg/dL (ref 70–99)

## 2012-12-20 LAB — PROTEIN / CREATININE RATIO, URINE
Creatinine, Urine: 138.5 mg/dL
Protein Creatinine Ratio: 0.1 (ref 0.00–0.15)
Total Protein, Urine: 13.3 mg/dL

## 2012-12-20 MED ORDER — ACETAMINOPHEN 500 MG PO TABS
1000.0000 mg | ORAL_TABLET | Freq: Four times a day (QID) | ORAL | Status: DC | PRN
  Administered 2012-12-20 – 2012-12-21 (×2): 1000 mg via ORAL
  Filled 2012-12-20 (×2): qty 2

## 2012-12-20 MED ORDER — GENTAMICIN SULFATE 40 MG/ML IJ SOLN
170.0000 mg | Freq: Three times a day (TID) | INTRAVENOUS | Status: DC
  Administered 2012-12-21: 170 mg via INTRAVENOUS
  Filled 2012-12-20 (×3): qty 4.25

## 2012-12-20 MED ORDER — TERBUTALINE SULFATE 1 MG/ML IJ SOLN: 0.2500 mg | Freq: Once | INTRAMUSCULAR | Status: AC | PRN

## 2012-12-20 MED ORDER — PHENYLEPHRINE 40 MCG/ML (10ML) SYRINGE FOR IV PUSH (FOR BLOOD PRESSURE SUPPORT)
80.0000 ug | PREFILLED_SYRINGE | INTRAVENOUS | Status: DC | PRN
  Filled 2012-12-20: qty 5

## 2012-12-20 MED ORDER — PHENYLEPHRINE 40 MCG/ML (10ML) SYRINGE FOR IV PUSH (FOR BLOOD PRESSURE SUPPORT): 80.0000 ug | PREFILLED_SYRINGE | INTRAVENOUS | Status: DC | PRN

## 2012-12-20 MED ORDER — LIDOCAINE HCL (PF) 1 % IJ SOLN
INTRAMUSCULAR | Status: DC | PRN
  Administered 2012-12-20 (×4): 4 mL

## 2012-12-20 MED ORDER — LACTATED RINGERS IV SOLN
500.0000 mL | Freq: Once | INTRAVENOUS | Status: AC
Start: 2012-12-20 — End: 2012-12-20
  Administered 2012-12-20: 500 mL via INTRAVENOUS

## 2012-12-20 MED ORDER — DIPHENHYDRAMINE HCL 50 MG/ML IJ SOLN: 12.5000 mg | INTRAMUSCULAR | Status: DC | PRN

## 2012-12-20 MED ORDER — EPHEDRINE 5 MG/ML INJ: 10.0000 mg | INTRAVENOUS | Status: DC | PRN

## 2012-12-20 MED ORDER — FENTANYL 2.5 MCG/ML BUPIVACAINE 1/10 % EPIDURAL INFUSION (WH - ANES)
14.0000 mL/h | INTRAMUSCULAR | Status: DC | PRN
  Administered 2012-12-20 – 2012-12-21 (×4): 14 mL/h via EPIDURAL
  Filled 2012-12-20 (×4): qty 125

## 2012-12-20 MED ORDER — OXYTOCIN 40 UNITS IN LACTATED RINGERS INFUSION - SIMPLE MED
1.0000 m[IU]/min | INTRAVENOUS | Status: DC
  Administered 2012-12-20: 2 m[IU]/min via INTRAVENOUS

## 2012-12-20 MED ORDER — GENTAMICIN SULFATE 40 MG/ML IJ SOLN
180.0000 mg | Freq: Once | INTRAVENOUS | Status: AC
  Administered 2012-12-20: 180 mg via INTRAVENOUS
  Filled 2012-12-20: qty 4.5

## 2012-12-20 MED ORDER — SODIUM CHLORIDE 0.9 % IV SOLN
2.0000 g | Freq: Four times a day (QID) | INTRAVENOUS | Status: DC
  Administered 2012-12-20 – 2012-12-21 (×2): 2 g via INTRAVENOUS
  Filled 2012-12-20 (×6): qty 2000

## 2012-12-20 MED ORDER — EPHEDRINE 5 MG/ML INJ
10.0000 mg | INTRAVENOUS | Status: DC | PRN
  Filled 2012-12-20: qty 4

## 2012-12-20 NOTE — Progress Notes (Signed)
Catherine [name] is a 37 y.o. G1P0000 at [redacted]w[redacted]d admitted for induction of labor due to [facility].  Subjective: Comfortable w/ epidural. Denies ha, scotomata, ruq/epigastric pain, n/v.   Felt some LOF, RN got slide for fern  Objective: BP 158/90  Pulse 89  Temp(Src) 98.7 F (37.1 C) (Oral)  Resp 18  Ht 5\' 4"  (1.626 m)  Wt 91.627 kg (202 lb)  BMI 34.66 kg/m2  SpO2 97%   Total I/O In: -  Out: 200 [Urine:200]  CBG (last 3)   Recent Labs  12/20/12 1352 12/20/12 1538 12/20/12 1732  GLUCAP 72 85 74     FHT:  FHR: 150 bpm, variability: min-mod,  accelerations:  Present,  decelerations:  Present mild variable w/ arom of forebag UC:   regular, every 1-6 minutes SVE:   Dilation: 6 Effacement (%): 90 Station: -1 Exam by:: Mry Lamia, CNM [employer], but still had bulging forebag w/ exam, so forebag arom'd large amount clear fluid  Labs: Lab Results  Component Value Date   WBC 10.6* 12/20/2012   HGB 11.3* 12/20/2012   HCT 33.2* 12/20/2012   MCV 83.6 12/20/2012   PLT 150 12/20/2012    Assessment / Plan: IOL d/t GHTN/A1DM, cbg's great, bp's 140-150s/80-90s, SROM w/ AROM of forebag, pitocin at 26mu/min.   Labor: Progressing normally Preeclampsia:  n/a Fetal Wellbeing:  Category II Pain Control:  Epidural I/D:  n/a Anticipated MOD:  NSVD  Marge Duncans 12/20/2012, 6:54 PM

## 2012-12-20 NOTE — Anesthesia Procedure Notes (Signed)
Epidural Patient location during procedure: OB Start time: 12/20/2012 9:09 AM  Staffing Performed by: anesthesiologist   Preanesthetic Checklist Completed: patient identified, site marked, surgical consent, pre-op evaluation, timeout performed, IV checked, risks and benefits discussed and monitors and equipment checked  Epidural Patient position: sitting Prep: site prepped and draped and DuraPrep Patient monitoring: continuous pulse ox and blood pressure Approach: midline Injection technique: LOR air  Needle:  Needle type: Tuohy  Needle gauge: 17 G Needle length: 9 cm and 9 Needle insertion depth: 6.5 cm Catheter type: closed end flexible Catheter size: 19 Gauge Catheter at skin depth: 11.5 cm Test dose: negative  Assessment Events: blood not aspirated, injection not painful, no injection resistance, negative IV test and no paresthesia  Additional Notes Discussed risk of headache, infection, bleeding, nerve injury and failed or incomplete block.  Patient voices understanding and wishes to proceed.  Epidural placed easily on first attempt.  No paresthesia.  Patient tolerated procedure well with no apparent complications.  Jasmine December, MDReason for block:procedure for pain

## 2012-12-20 NOTE — Progress Notes (Signed)
Booker, CNM, notified of tachysystole with decreasing variability and pitocin currently on 22 milliunits. Orders to leave pitocin at current dose and monitor. Orders received to notify CNM in the next 20-30 minutes if UC pattern continues and if variability continues to decrease.

## 2012-12-20 NOTE — Progress Notes (Addendum)
Catherine [name] is a 37 y.o. G1P0000 at [redacted]w[redacted]d admitted for induction of labor due to [facility].  Subjective: Comfortable w/ epidural, no complaints  Objective: BP 122/68  Pulse 91  Temp(Src) 98.9 F (37.2 C) (Oral)  Resp 18  Ht 5\' 4"  (1.626 m)  Wt 91.627 kg (202 lb)  BMI 34.66 kg/m2  SpO2 97%   Total I/O In: -  Out: 200 [Urine:200]  CBG (last 3)   Recent Labs  12/20/12 0923 12/20/12 1122 12/20/12 1352  GLUCAP 91 75 72     FHT:  FHR: 150 bpm, variability: moderate,  accelerations:  Present,  decelerations:  Absent UC:   regular, every 1-4 minutes SVE:   Dilation: 5.5 Effacement (%): 90 Station: -2;-1 Exam by:: Renaldo Harrison, RN Just recently checked by RN, will defer exam for now  Labs: Lab Results  Component Value Date   WBC 10.6* 12/20/2012   HGB 11.3* 12/20/2012   HCT 33.2* 12/20/2012   MCV 83.6 12/20/2012   PLT 150 12/20/2012    Assessment / Plan: IOL d/t GHTN/A1DM, cbg's great, pitocin at 19mu/min, RN to continue increasing as needed per protocol to achieve adequate labor. BPs wnl.   Labor: n/a Preeclampsia:  n/a Fetal Wellbeing:  Category I Pain Control:  Epidural I/D:  n/a Anticipated MOD:  NSVD  Catherine [name] 12/20/2012, 3:38 PM

## 2012-12-20 NOTE — Progress Notes (Signed)
Catherine [name] is a 37 y.o. G1P0000 at [redacted]w[redacted]d admitted for induction of labor due to [facility] and A1DM.  Subjective: Comfortable w/ epidural. Denies ha, scotomata, ruq/epigastric pain, n/v.   Foley bulb fell out a little after 0800  Objective: BP 142/80  Pulse 105  Temp(Src) 98.5 F (36.9 C) (Oral)  Resp 20  Ht 5\' 4"  (1.626 m)  Wt 91.627 kg (202 lb)  BMI 34.66 kg/m2  SpO2 97%   Total I/O In: -  Out: 200 [Urine:200]  CBG (last 3)   Recent Labs  12/20/12 0218 12/20/12 0522  GLUCAP 93 101*     FHT:  FHR: 150 bpm, variability: moderate,  accelerations:  Present,  decelerations:  Present occ mild variables UC:   irregular, every 1-8 minutes, dysfunctional pattern, coupling/tripling SVE:   Dilation: 5 Effacement (%): 80 Station: -2 Exam by:: [employer], CNM  Labs: Lab Results  Component Value Date   WBC 10.6* 12/20/2012   HGB 11.3* 12/20/2012   HCT 33.2* 12/20/2012   MCV 83.6 12/20/2012   PLT 150 12/20/2012    Assessment / Plan: IOL d/t GHTN/A1DM, foley bulb out, will begin pitocin per protocol  Labor: n/a Preeclampsia:  n/a Fetal Wellbeing:  Category II Pain Control:  Epidural I/D:  n/a Anticipated MOD:  NSVD  Marge Duncans 12/20/2012, 9:46 AM

## 2012-12-20 NOTE — Progress Notes (Signed)
Negin Hegg is a 37 y.o. G1P0000 at [redacted]w[redacted]d admitted for induction of labor due to [facility].  Subjective: Mostly comfortable w/ epidural, still feeling lots of pressure  Objective: BP 141/66  Pulse 89  Temp(Src) 100.1 F (37.8 C) (Axillary)  Resp 18  Ht 5\' 4"  (1.626 m)  Wt 91.627 kg (202 lb)  BMI 34.66 kg/m2  SpO2 97% I/O last 3 completed shifts: In: -  Out: 200 [Urine:200]   CBG (last 3)   Recent Labs  12/20/12 1732 12/20/12 1934 12/20/12 2131  GLUCAP 74 101* 81     FHT:  FHR: 150 bpm, variability: moderate,  accelerations:  Abscent,  decelerations:  Present occ variables UC:   regular, every 1-3 minutes SVE:  8/90/0 w/ caput, IUPC placed w/o difficulty Pos scalp stim  Labs: Lab Results  Component Value Date   WBC 10.6* 12/20/2012   HGB 11.3* 12/20/2012   HCT 33.2* 12/20/2012   MCV 83.6 12/20/2012   PLT 150 12/20/2012    Assessment / Plan: IOL d/t GHTN/A1DM, pitocin @ 72mu/min, no cervical change, IUPC placed, cbg's and bp's stable Placed in Rt exaggerated sims Temp 100.1ax after apap 1gm po, will place ice packs in axilla/groin  Labor: protracted active phase Preeclampsia:  n/a Fetal Wellbeing:  Category II Pain Control:  Epidural I/D:  amp & gent for presumed chorioamnionitis Anticipated MOD:  NSVD  Marge Duncans 12/20/2012, 10:56 PM

## 2012-12-20 NOTE — Anesthesia Preprocedure Evaluation (Addendum)
Anesthesia Evaluation  Patient identified by MRN, date of birth, ID band Patient awake    Reviewed: Allergy & Precautions, H&P , NPO status , Patient's Chart, lab work & pertinent test results, reviewed documented beta blocker date and time   History of Anesthesia Complications Negative for: history of anesthetic complications  Airway Mallampati: III TM Distance: >3 FB Neck ROM: full    Dental  (+) Teeth Intact   Pulmonary neg pulmonary ROS,  breath sounds clear to auscultation        Cardiovascular hypertension (GHTN), Rhythm:regular Rate:Normal     Neuro/Psych negative neurological ROS  negative psych ROS   GI/Hepatic Neg liver ROS, GERD-  Medicated,  Endo/Other  diabetes (diet-controlled), GestationalBMI 35  Renal/GU negative Renal ROS     Musculoskeletal   Abdominal   Peds  Hematology negative hematology ROS (+)   Anesthesia Other Findings   Reproductive/Obstetrics (+) Pregnancy (CPD --> C/S)                         Anesthesia Physical Anesthesia Plan  ASA: III and emergent  Anesthesia Plan: Epidural   Post-op Pain Management:    Induction:   Airway Management Planned:   Additional Equipment:   Intra-op Plan:   Post-operative Plan:   Informed Consent: I have reviewed the patients History and Physical, chart, labs and discussed the procedure including the risks, benefits and alternatives for the proposed anesthesia with the patient or authorized representative who has indicated his/her understanding and acceptance.     Plan Discussed with:   Anesthesia Plan Comments:        Anesthesia Quick Evaluation

## 2012-12-20 NOTE — Progress Notes (Signed)
Catherine [name] is a 37 y.o. G1P0000 at [redacted]w[redacted]d admitted for induction of labor due to [facility] and A1DM.  Subjective: Feeling lots of pressure. Denies ha, scotomata, ruq/epigastric pain, n/v.    Objective: BP 162/79  Pulse 88  Temp(Src) 99.8 F (37.7 C) (Axillary)  Resp 18  Ht 5\' 4"  (1.626 m)  Wt 91.627 kg (202 lb)  BMI 34.66 kg/m2  SpO2 97% I/O last 3 completed shifts: In: -  Out: 200 [Urine:200] Last few recorded BPs 160-170s/70-100s, RN states pt was lying on cuff. Switched arms and latest reading which is not yet filed was 147/77.   CBG (last 3)   Recent Labs  12/20/12 1538 12/20/12 1732 12/20/12 1934  GLUCAP 85 74 101*      FHT:  FHR: 150 bpm, variability: min-mod,  accelerations:  Present,  decelerations:  Present occ mild variables UC:   regular, every 1-3 minutes SVE:   Dilation: 8 Effacement (%): 90 Station: 0 Exam by:: e. poore, rn  Labs: Lab Results  Component Value Date   WBC 10.6* 12/20/2012   HGB 11.3* 12/20/2012   HCT 33.2* 12/20/2012   MCV 83.6 12/20/2012   PLT 150 12/20/2012    Assessment / Plan: Induction of labor due to GHTN/A1DM,  progressing well on pitocin Presumed chorioamnionitis  Labor: Progressing normally Preeclampsia:  few elevated bp's, but was lying on cuff, retake after arm change was back to pt's baseline of 140s/70s Fetal Wellbeing:  Category II Pain Control:  Epidural I/D:  ampicillin and gentamicin per protocol for presumed chorioamnionitis, apap 1gm po for fever Anticipated MOD:  NSVD  Marge Duncans 12/20/2012, 9:28 PM

## 2012-12-20 NOTE — Progress Notes (Signed)
ANTIBIOTIC CONSULT NOTE - INITIAL  Pharmacy Consult for : Gentamicin Indication: r/o chorioamnionitis  No Known Allergies  Patient Measurements: Height: 5\' 4"  (162.6 cm) Weight: 202 lb (91.627 kg) IBW/kg (Calculated) : 54.7 Adjusted Body Weight: 65.8 kg  Vital Signs: Temp: 99.8 F (37.7 C) (10/11 2053) Temp src: Axillary (10/11 2053) BP: 148/80 mmHg (10/11 2201) Pulse Rate: 92 (10/11 2201)       Labs:  Recent Labs  12/19/12 2020 12/20/12 0030 12/20/12 0825  WBC 9.6  --  10.6*  HGB 11.9*  --  11.3*  PLT 171  --  150  LABCREA  --  138.50  --   CREATININE 0.52  --   --    Estimated Creatinine Clearance: 107.7 ml/min (by C-G formula based on Cr of 0.52). No results found for this basename: VANCOTROUGH, VANCOPEAK, VANCORANDOM, GENTTROUGH, GENTPEAK, GENTRANDOM, TOBRATROUGH, TOBRAPEAK, TOBRARND, AMIKACINPEAK, AMIKACINTROU, AMIKACIN,  in the last 72 hours   Microbiology:   Medical History: Past Medical History  Diagnosis Date  . Gestational diabetes     diet controlled  . Medical history non-contributory   . Gestational hypertension   . Advanced maternal age (AMA) in pregnancy   . Late prenatal care     Medications: Ampcillin 2gm IV Q6 hours.  Assessment: r/o chorioamnionitis   Goal of Therapy: Peak~ 6-7mg /L, Trough ~ < 1mg /L  Plan: Load Gent 180mg . 2) Maintenance Gent 170mg  IV Q8 hours to begin 8 hours after load.  [name], Catherine [name] 12/20/2012,10:22 PM

## 2012-12-21 ENCOUNTER — Encounter (HOSPITAL_COMMUNITY)
Admission: RE | Disposition: A | Discharge status: Home / Self Care | Payer: Self-pay | Source: Ambulatory Visit | Attending: Obstetrics & Gynecology

## 2012-12-21 ENCOUNTER — Encounter (HOSPITAL_COMMUNITY): Payer: Self-pay

## 2012-12-21 DIAGNOSIS — O139 Gestational [pregnancy-induced] hypertension without significant proteinuria, unspecified trimester: Secondary | ICD-10-CM

## 2012-12-21 DIAGNOSIS — O324XX0 Maternal care for high head at term, not applicable or unspecified: Secondary | ICD-10-CM

## 2012-12-21 LAB — GLUCOSE, CAPILLARY
Glucose-Capillary: 99 mg/dL (ref 70–99)
Glucose-Capillary: 118 mg/dL — ABNORMAL HIGH (ref 70–99)
Glucose-Capillary: 127 mg/dL — ABNORMAL HIGH (ref 70–99)

## 2012-12-21 LAB — CBC
HCT: 32 % — ABNORMAL LOW (ref 36.0–46.0)
Hemoglobin: 10.7 g/dL — ABNORMAL LOW (ref 12.0–15.0)
MCH: 27.8 pg (ref 26.0–34.0)
MCV: 83.1 fL (ref 78.0–100.0)
RBC: 3.85 MIL/uL — ABNORMAL LOW (ref 3.87–5.11)
RDW: 14.8 % (ref 11.5–15.5)

## 2012-12-21 LAB — ABO/RH: ABO/RH(D): O POS

## 2012-12-21 LAB — TYPE AND SCREEN
ABO/RH(D): O POS
Antibody Screen: NEGATIVE

## 2012-12-21 SURGERY — Surgical Case
Anesthesia: Epidural | Site: Abdomen | Wound class: Clean Contaminated

## 2012-12-21 MED ORDER — TETANUS-DIPHTH-ACELL PERTUSSIS 5-2.5-18.5 LF-MCG/0.5 IM SUSP
0.5000 mL | Freq: Once | INTRAMUSCULAR | Status: DC
  Filled 2012-12-21: qty 0.5

## 2012-12-21 MED ORDER — DIPHENHYDRAMINE HCL 25 MG PO CAPS: 25.0000 mg | ORAL_CAPSULE | Freq: Four times a day (QID) | ORAL | Status: DC | PRN

## 2012-12-21 MED ORDER — LIDOCAINE-EPINEPHRINE (PF) 2 %-1:200000 IJ SOLN
INTRAMUSCULAR | Status: AC
  Filled 2012-12-21: qty 20

## 2012-12-21 MED ORDER — MENTHOL 3 MG MT LOZG: 1.0000 | LOZENGE | OROMUCOSAL | Status: DC | PRN

## 2012-12-21 MED ORDER — LACTATED RINGERS IV SOLN
INTRAVENOUS | Status: DC
  Administered 2012-12-21: 03:00:00 via INTRAUTERINE

## 2012-12-21 MED ORDER — SIMETHICONE 80 MG PO CHEW: 80.0000 mg | CHEWABLE_TABLET | ORAL | Status: DC | PRN

## 2012-12-21 MED ORDER — NALBUPHINE SYRINGE 5 MG/0.5 ML
5.0000 mg | INJECTION | INTRAMUSCULAR | Status: DC | PRN
  Filled 2012-12-21: qty 1

## 2012-12-21 MED ORDER — DIPHENHYDRAMINE HCL 25 MG PO CAPS: 25.0000 mg | ORAL_CAPSULE | ORAL | Status: DC | PRN

## 2012-12-21 MED ORDER — NALOXONE HCL 1 MG/ML IJ SOLN
1.0000 ug/kg/h | INTRAVENOUS | Status: DC | PRN
  Filled 2012-12-21: qty 2

## 2012-12-21 MED ORDER — DIBUCAINE 1 % RE OINT: 1.0000 "application " | TOPICAL_OINTMENT | RECTAL | Status: DC | PRN

## 2012-12-21 MED ORDER — BUPIVACAINE LIPOSOME 1.3 % IJ SUSP
20.0000 mL | Freq: Once | INTRAMUSCULAR | Status: AC
  Administered 2012-12-21: 20 mL
  Filled 2012-12-21: qty 20

## 2012-12-21 MED ORDER — FLEET ENEMA 7-19 GM/118ML RE ENEM: 1.0000 | ENEMA | Freq: Every day | RECTAL | Status: DC | PRN

## 2012-12-21 MED ORDER — NALOXONE HCL 0.4 MG/ML IJ SOLN: 0.4000 mg | INTRAMUSCULAR | Status: DC | PRN

## 2012-12-21 MED ORDER — DIPHENHYDRAMINE HCL 50 MG/ML IJ SOLN: 12.5000 mg | INTRAMUSCULAR | Status: DC | PRN

## 2012-12-21 MED ORDER — 0.9 % SODIUM CHLORIDE (POUR BTL) OPTIME
TOPICAL | Status: DC | PRN
  Administered 2012-12-21: 1000 mL

## 2012-12-21 MED ORDER — SCOPOLAMINE 1 MG/3DAYS TD PT72
1.0000 | MEDICATED_PATCH | Freq: Once | TRANSDERMAL | Status: DC
  Administered 2012-12-21: 1.5 mg via TRANSDERMAL

## 2012-12-21 MED ORDER — SODIUM CHLORIDE 0.9 % IJ SOLN: 3.0000 mL | INTRAMUSCULAR | Status: DC | PRN

## 2012-12-21 MED ORDER — PIPERACILLIN-TAZOBACTAM 3.375 G IVPB
3.3750 g | Freq: Four times a day (QID) | INTRAVENOUS | Status: AC
  Administered 2012-12-21 – 2012-12-22 (×5): 3.375 g via INTRAVENOUS
  Filled 2012-12-21 (×5): qty 50

## 2012-12-21 MED ORDER — WITCH HAZEL-GLYCERIN EX PADS: 1.0000 "application " | MEDICATED_PAD | CUTANEOUS | Status: DC | PRN

## 2012-12-21 MED ORDER — SIMETHICONE 80 MG PO CHEW
80.0000 mg | CHEWABLE_TABLET | Freq: Three times a day (TID) | ORAL | Status: DC
  Administered 2012-12-21 – 2012-12-24 (×10): 80 mg via ORAL
  Filled 2012-12-21 (×10): qty 1

## 2012-12-21 MED ORDER — KETOROLAC TROMETHAMINE 60 MG/2ML IM SOLN
INTRAMUSCULAR | Status: AC
  Filled 2012-12-21: qty 2

## 2012-12-21 MED ORDER — PHENYLEPHRINE HCL 10 MG/ML IJ SOLN
INTRAMUSCULAR | Status: DC | PRN
  Administered 2012-12-21 (×5): 40 ug via INTRAVENOUS
  Administered 2012-12-21: 80 ug via INTRAVENOUS
  Administered 2012-12-21 (×4): 40 ug via INTRAVENOUS
  Administered 2012-12-21: 80 ug via INTRAVENOUS
  Administered 2012-12-21 (×3): 40 ug via INTRAVENOUS

## 2012-12-21 MED ORDER — KETOROLAC TROMETHAMINE 30 MG/ML IJ SOLN: 30.0000 mg | Freq: Four times a day (QID) | INTRAMUSCULAR | Status: DC | PRN

## 2012-12-21 MED ORDER — SENNOSIDES-DOCUSATE SODIUM 8.6-50 MG PO TABS
2.0000 | ORAL_TABLET | ORAL | Status: DC
  Administered 2012-12-21 – 2012-12-23 (×3): 2 via ORAL
  Filled 2012-12-21 (×3): qty 2

## 2012-12-21 MED ORDER — LABETALOL HCL 5 MG/ML IV SOLN
20.0000 mg | INTRAVENOUS | Status: DC | PRN
  Administered 2012-12-21: 20 mg via INTRAVENOUS
  Administered 2012-12-21: 40 mg via INTRAVENOUS
  Administered 2012-12-21: 20 mg via INTRAVENOUS
  Filled 2012-12-21 (×4): qty 4

## 2012-12-21 MED ORDER — MORPHINE SULFATE (PF) 0.5 MG/ML IJ SOLN
INTRAMUSCULAR | Status: DC | PRN
  Administered 2012-12-21: 4 mg via EPIDURAL

## 2012-12-21 MED ORDER — MAGNESIUM SULFATE BOLUS VIA INFUSION
4.0000 g | Freq: Once | INTRAVENOUS | Status: AC
  Administered 2012-12-21: 4 g via INTRAVENOUS
  Filled 2012-12-21: qty 500

## 2012-12-21 MED ORDER — SODIUM BICARBONATE 8.4 % IV SOLN
INTRAVENOUS | Status: AC
  Filled 2012-12-21: qty 50

## 2012-12-21 MED ORDER — OXYCODONE-ACETAMINOPHEN 5-325 MG PO TABS
1.0000 | ORAL_TABLET | ORAL | Status: DC | PRN
  Administered 2012-12-22 – 2012-12-23 (×4): 1 via ORAL
  Administered 2012-12-24: 2 via ORAL
  Administered 2012-12-24: 1 via ORAL
  Filled 2012-12-21: qty 2
  Filled 2012-12-21 (×5): qty 1

## 2012-12-21 MED ORDER — SIMETHICONE 80 MG PO CHEW
80.0000 mg | CHEWABLE_TABLET | ORAL | Status: DC
  Administered 2012-12-23: 80 mg via ORAL

## 2012-12-21 MED ORDER — OXYTOCIN 10 UNIT/ML IJ SOLN
INTRAMUSCULAR | Status: AC
  Filled 2012-12-21: qty 4

## 2012-12-21 MED ORDER — ONDANSETRON HCL 4 MG/2ML IJ SOLN: 4.0000 mg | INTRAMUSCULAR | Status: DC | PRN

## 2012-12-21 MED ORDER — PRENATAL MULTIVITAMIN CH
1.0000 | ORAL_TABLET | Freq: Every day | ORAL | Status: DC
  Administered 2012-12-22 – 2012-12-24 (×3): 1 via ORAL
  Filled 2012-12-21 (×3): qty 1

## 2012-12-21 MED ORDER — LANOLIN HYDROUS EX OINT: 1.0000 "application " | TOPICAL_OINTMENT | CUTANEOUS | Status: DC | PRN

## 2012-12-21 MED ORDER — ONDANSETRON HCL 4 MG PO TABS: 4.0000 mg | ORAL_TABLET | ORAL | Status: DC | PRN

## 2012-12-21 MED ORDER — ONDANSETRON HCL 4 MG/2ML IJ SOLN: 4.0000 mg | Freq: Three times a day (TID) | INTRAMUSCULAR | Status: DC | PRN

## 2012-12-21 MED ORDER — ONDANSETRON HCL 4 MG/2ML IJ SOLN
INTRAMUSCULAR | Status: AC
  Filled 2012-12-21: qty 2

## 2012-12-21 MED ORDER — LACTATED RINGERS IV SOLN
INTRAVENOUS | Status: DC
  Administered 2012-12-21: 22:00:00 via INTRAVENOUS

## 2012-12-21 MED ORDER — KETOROLAC TROMETHAMINE 60 MG/2ML IM SOLN
60.0000 mg | Freq: Once | INTRAMUSCULAR | Status: AC | PRN
  Administered 2012-12-21: 60 mg via INTRAMUSCULAR

## 2012-12-21 MED ORDER — DIPHENHYDRAMINE HCL 50 MG/ML IJ SOLN: 25.0000 mg | INTRAMUSCULAR | Status: DC | PRN

## 2012-12-21 MED ORDER — MORPHINE SULFATE 0.5 MG/ML IJ SOLN
INTRAMUSCULAR | Status: AC
  Filled 2012-12-21: qty 10

## 2012-12-21 MED ORDER — MAGNESIUM SULFATE 40 G IN LACTATED RINGERS - SIMPLE
2.0000 g/h | INTRAVENOUS | Status: DC
  Filled 2012-12-21: qty 500

## 2012-12-21 MED ORDER — OXYTOCIN 40 UNITS IN LACTATED RINGERS INFUSION - SIMPLE MED: 62.5000 mL/h | INTRAVENOUS | Status: AC

## 2012-12-21 MED ORDER — SCOPOLAMINE 1 MG/3DAYS TD PT72
MEDICATED_PATCH | TRANSDERMAL | Status: AC
  Filled 2012-12-21: qty 1

## 2012-12-21 MED ORDER — MEPERIDINE HCL 25 MG/ML IJ SOLN: 6.2500 mg | INTRAMUSCULAR | Status: DC | PRN

## 2012-12-21 MED ORDER — BISACODYL 10 MG RE SUPP: 10.0000 mg | Freq: Every day | RECTAL | Status: DC | PRN

## 2012-12-21 MED ORDER — METOCLOPRAMIDE HCL 5 MG/ML IJ SOLN: 10.0000 mg | Freq: Three times a day (TID) | INTRAMUSCULAR | Status: DC | PRN

## 2012-12-21 MED ORDER — SODIUM CHLORIDE 0.9 % IJ SOLN
INTRAMUSCULAR | Status: AC
  Filled 2012-12-21: qty 50

## 2012-12-21 MED ORDER — OXYTOCIN 10 UNIT/ML IJ SOLN
40.0000 [IU] | INTRAVENOUS | Status: DC | PRN
  Administered 2012-12-21: 40 [IU] via INTRAVENOUS

## 2012-12-21 MED ORDER — ZOLPIDEM TARTRATE 5 MG PO TABS: 5.0000 mg | ORAL_TABLET | Freq: Every evening | ORAL | Status: DC | PRN

## 2012-12-21 MED ORDER — ONDANSETRON HCL 4 MG/2ML IJ SOLN
INTRAMUSCULAR | Status: DC | PRN
  Administered 2012-12-21: 4 mg via INTRAMUSCULAR

## 2012-12-21 MED ORDER — MAGNESIUM SULFATE 40 G IN LACTATED RINGERS - SIMPLE
2.0000 g/h | INTRAVENOUS | Status: AC
  Administered 2012-12-22: 2 g/h via INTRAVENOUS
  Filled 2012-12-21: qty 500

## 2012-12-21 MED ORDER — SODIUM BICARBONATE 8.4 % IV SOLN
INTRAVENOUS | Status: DC | PRN
  Administered 2012-12-21: 5 mL via EPIDURAL
  Administered 2012-12-21: 2 mL via EPIDURAL
  Administered 2012-12-21: 5 mL via EPIDURAL
  Administered 2012-12-21: 3 mL via EPIDURAL

## 2012-12-21 MED ORDER — FENTANYL CITRATE 0.05 MG/ML IJ SOLN
INTRAMUSCULAR | Status: AC
  Administered 2012-12-21: 50 ug via INTRAVENOUS
  Filled 2012-12-21: qty 2

## 2012-12-21 MED ORDER — IBUPROFEN 600 MG PO TABS
600.0000 mg | ORAL_TABLET | Freq: Four times a day (QID) | ORAL | Status: DC
  Administered 2012-12-21 – 2012-12-24 (×11): 600 mg via ORAL
  Filled 2012-12-21 (×12): qty 1

## 2012-12-21 MED ORDER — FENTANYL CITRATE 0.05 MG/ML IJ SOLN
25.0000 ug | INTRAMUSCULAR | Status: DC | PRN
  Administered 2012-12-21 (×2): 50 ug via INTRAVENOUS

## 2012-12-21 SURGICAL SUPPLY — 39 items
CLAMP CORD UMBIL (MISCELLANEOUS) ×2 IMPLANT
CLOTH BEACON ORANGE TIMEOUT ST (SAFETY) ×2 IMPLANT
DERMABOND ADVANCED (GAUZE/BANDAGES/DRESSINGS) ×2
DERMABOND ADVANCED .7 DNX12 (GAUZE/BANDAGES/DRESSINGS) ×2 IMPLANT
DRAPE LG THREE QUARTER DISP (DRAPES) ×4 IMPLANT
DRSG OPSITE POSTOP 4X10 (GAUZE/BANDAGES/DRESSINGS) ×2 IMPLANT
DURAPREP 26ML APPLICATOR (WOUND CARE) ×4 IMPLANT
ELECT REM PT RETURN 9FT ADLT (ELECTROSURGICAL) ×2
ELECTRODE REM PT RTRN 9FT ADLT (ELECTROSURGICAL) ×1 IMPLANT
EXTRACTOR VACUUM BELL STYLE (SUCTIONS) IMPLANT
GLOVE BIOGEL PI IND STRL 7.0 (GLOVE) ×3 IMPLANT
GLOVE BIOGEL PI IND STRL 8 (GLOVE) ×1 IMPLANT
GLOVE BIOGEL PI INDICATOR 7.0 (GLOVE) ×3
GLOVE BIOGEL PI INDICATOR 8 (GLOVE) ×1
GLOVE ECLIPSE 8.0 STRL XLNG CF (GLOVE) ×2 IMPLANT
GOWN PREVENTION PLUS XLARGE (GOWN DISPOSABLE) ×2 IMPLANT
GOWN STRL REIN XL XLG (GOWN DISPOSABLE) ×2 IMPLANT
KIT ABG SYR 3ML LUER SLIP (SYRINGE) ×2 IMPLANT
NEEDLE HYPO 18GX1.5 BLUNT FILL (NEEDLE) ×2 IMPLANT
NEEDLE HYPO 22GX1.5 SAFETY (NEEDLE) ×2 IMPLANT
NEEDLE HYPO 25X5/8 SAFETYGLIDE (NEEDLE) ×2 IMPLANT
NS IRRIG 1000ML POUR BTL (IV SOLUTION) ×2 IMPLANT
PACK C SECTION WH (CUSTOM PROCEDURE TRAY) ×2 IMPLANT
PAD OB MATERNITY 4.3X12.25 (PERSONAL CARE ITEMS) ×2 IMPLANT
RTRCTR C-SECT PINK 25CM LRG (MISCELLANEOUS) IMPLANT
STAPLER VISISTAT 35W (STAPLE) IMPLANT
SUT CHROMIC 0 CT 1 (SUTURE) ×2 IMPLANT
SUT MNCRL 0 VIOLET CTX 36 (SUTURE) ×2 IMPLANT
SUT MONOCRYL 0 CTX 36 (SUTURE) ×2
SUT PLAIN 2 0 (SUTURE)
SUT PLAIN 2 0 XLH (SUTURE) IMPLANT
SUT PLAIN ABS 2-0 CT1 27XMFL (SUTURE) IMPLANT
SUT VIC AB 0 CTX 36 (SUTURE) ×1
SUT VIC AB 0 CTX36XBRD ANBCTRL (SUTURE) ×1 IMPLANT
SUT VIC AB 4-0 KS 27 (SUTURE) ×2 IMPLANT
SYR 20CC LL (SYRINGE) ×4 IMPLANT
TOWEL OR 17X24 6PK STRL BLUE (TOWEL DISPOSABLE) ×2 IMPLANT
TRAY FOLEY CATH 14FR (SET/KITS/TRAYS/PACK) ×2 IMPLANT
WATER STERILE IRR 1000ML POUR (IV SOLUTION) IMPLANT

## 2012-12-21 NOTE — Progress Notes (Signed)
Catherine [name] is a 37 y.o. G1P0000 at [redacted]w[redacted]d admitted for induction of labor due to [facility].  Received call from RN stating repetitive variables, uc's q 1-13mins w/ now inadequate MVUs and increasing uterine baseline, requests order to d/c or turn down pit.   Subjective: Comfortable, no complaints  Objective: BP 134/74  Pulse 91  Temp(Src) 98.4 F (36.9 C) (Oral)  Resp 20  Ht 5\' 4"  (1.626 m)  Wt 91.627 kg (202 lb)  BMI 34.66 kg/m2  SpO2 97% I/O last 3 completed shifts: In: -  Out: 200 [Urine:200] Total I/O In: 2912.6 [P.O.:120; I.V.:2638.1; IV Piggyback:154.5] Out: 35 [Urine:35]  CBG (last 3)   Recent Labs  12/20/12 2332 12/21/12 0140 12/21/12 0358  GLUCAP 91 98 99     FHT:  FHR: 150 bpm, variability: minimal ,  accelerations:  Abscent,  decelerations:  Present repetitive mild-mod variables UC:   regular, every 1-2 minutes, MVUs 75-100, uterine baseline SVE:   Dilation: Lip/rim Effacement (%): 90 Station: 0 Exam by:: e. poore, rn  @ 0243  Labs: Lab Results  Component Value Date   WBC 10.6* 12/20/2012   HGB 11.3* 12/20/2012   HCT 33.2* 12/20/2012   MCV 83.6 12/20/2012   PLT 150 12/20/2012    Assessment / Plan: IOL d/t GHTN, developed severe range bp's, has had 2 doses of iv labetalol, bp's better now, had magnesium 4gm loading dose, now on 2gm/hr. Pitocin at 76mu/min, has been adequate, until recently- will decrease pitocin by 1/2 to see if tachysystole/uterine resting tone/decels resolve. Repetitive variables- amnioinfusion bolus, then 110ml/hr. Plan to recheck cx around 0500  Labor: sve deferred at this time Preeclampsia:  on magnesium sulfate and no signs or symptoms of toxicity Fetal Wellbeing:  Category II Pain Control:  Epidural I/D:  amp & gent Anticipated MOD:  NSVD  Marge Duncans 12/21/2012, 4:21 AM

## 2012-12-21 NOTE — Transfer of Care (Signed)
Immediate Anesthesia Transfer of Care Note  Patient: Catherine [name]  Procedure(s) Performed: Procedure(s): CESAREAN SECTION (N/A)  Patient Location: PACU  Anesthesia Type:Epidural  Level of Consciousness: awake and alert   Airway & Oxygen Therapy: Patient Spontanous Breathing  Post-op Assessment: Report given to PACU RN and Post -op Vital signs reviewed and stable  Post vital signs: Reviewed and stable  Complications: No apparent anesthesia complications

## 2012-12-21 NOTE — Progress Notes (Signed)
Catherine [name] is a 37 y.o. G1P0000 at [redacted]w[redacted]d admitted for induction of labor due to Gestational diabetes and Hypertension.  Subjective: Comfortable. Laboring down. Minimal change with pushing for 3.25hrs  Objective: BP 162/93  Pulse 112  Temp(Src) 99.7 F (37.6 C) (Axillary)  Resp 18  Ht 5\' 4"  (1.626 m)  Wt 91.627 kg (202 lb)  BMI 34.66 kg/m2  SpO2 97% I/O last 3 completed shifts: In: 3319.4 [P.O.:150; I.V.:3014.9; IV Piggyback:154.5] Out: 270 [Urine:270] Total I/O In: 150 [P.O.:100; I.V.:50] Out: 150 [Urine:150]  FHT:  FHR: 145 bpm, variability: minimal ,  accelerations:  Abscent,  decelerations:  Absent Lates present at 0600, resolved UC:   regular, every 3 minutes SVE:   Dilation: 10 Effacement (%): 100 Station: +1;+2 Exam by:: e. poore, rn  Labs: Lab Results  Component Value Date   WBC 10.6* 12/20/2012   HGB 11.3* 12/20/2012   HCT 33.2* 12/20/2012   MCV 83.6 12/20/2012   PLT 150 12/20/2012    Assessment / Plan: Induction of labor due to gestational hypertension and gestational diabetes,  progressing well on pitocin  Labor: pushing for 3:40 without signficant movement. If unable to delivery vaginally, will go to section Preeclampsia:  on magnesium sulfate and no signs or symptoms of toxicity Fetal Wellbeing:  Category II Pain Control:  Epidural I/D:  n/a Anticipated MOD:  Pending response to intervention  Toby Ayad RYAN 12/21/2012, 9:09 AM

## 2012-12-21 NOTE — Progress Notes (Signed)
Pt is breastfeeding. BP cuff off

## 2012-12-21 NOTE — Progress Notes (Signed)
Catherine [name] is a 37 y.o. G1P0000 at [redacted]w[redacted]d admitted for induction of labor due to [facility].  Subjective: Feeling lots of pressure. Denies ha, scotomata, ruq/epigastric pain, n/v.    Objective: BP 161/91  Pulse 83  Temp(Src) 98.5 F (36.9 C) (Axillary)  Resp 18  Ht 5\' 4"  (1.626 m)  Wt 91.627 kg (202 lb)  BMI 34.66 kg/m2  SpO2 97% I/O last 3 completed shifts: In: -  Out: 200 [Urine:200] 35cc cherry colored urine emptied for past 2hrs  Filed Vitals:   12/21/12 0147 12/21/12 0149 12/21/12 0201 12/21/12 0207  BP: 138/111 161/91 175/98 168/98  Pulse: 87 83 83 83  Temp: 99.1 F (37.3 C) 98.5 F (36.9 C)    TempSrc: Oral Axillary    Resp:   18   Height:      Weight:      SpO2:         CBG (last 3)   Recent Labs  12/20/12 2131 12/20/12 2332 12/21/12 0140  GLUCAP 81 91 98   Still feels hot to touch, temp 98.5ax but has ice packs to axilla, 99.1oral but has been drinking liquids  FHT:  FHR: 170 bpm, variability: minimal ,  accelerations:  Present,  decelerations:  Present mild variables. Occ 10x10 accel UC:   regular, every 1-4 minutes, MVUs adequate since IUPC placement, currently 200-235 SVE:   Dilation: Lip/rim Effacement (%): 90 Station: 0 Exam by:: e. poore, rn  O2 via NRBM placed, and fluid bolus in process  Labs: Lab Results  Component Value Date   WBC 10.6* 12/20/2012   HGB 11.3* 12/20/2012   HCT 33.2* 12/20/2012   MCV 83.6 12/20/2012   PLT 150 12/20/2012    Assessment / Plan: IOL d/t GHTN/A1DM, pitocin at 53mu/min, MVUs adequate, has made cervical change from 8 to 9.5 since IUPC placment. Persistent fever w/ fetal tachycardia despite apap 1gm po, iv antibiotics, and ice packs to axilla and groin. Not time for more apap until ~0330. Fetal tachycardia w/ minimal variability, although has occ 10x10 accel. UO decreased. Severe range BPs, pt denies any pre-e sx. Discussed all w/ Dr. Macon Large, will begin magnesium and labetalol per protocol d/t severe  range bp's and continue current management.   Labor: Progressing normally Preeclampsia:  GHTN now w/ severe range bp's, will begin magnesium and labetalol Fetal Wellbeing:  Category II Pain Control:  Epidural I/D:  amp & gent for presumed chorioamnionitis Anticipated MOD:  NSVD  Marge Duncans 12/21/2012, 2:08 AM

## 2012-12-21 NOTE — Anesthesia Postprocedure Evaluation (Signed)
  Anesthesia Post-op Note  Anesthesia Post Note  Patient: Catherine [name]  Procedure(s) Performed: Procedure(s) (LRB): CESAREAN SECTION (N/A)  Anesthesia type: Epidural  Patient location: PACU  Post pain: Pain level controlled  Post assessment: Post-op Vital signs reviewed  Last Vitals:  Filed Vitals:   12/21/12 1345  BP: 148/92  Pulse: 101  Temp: 36.7 C  Resp: 17    Post vital signs: stable  Level of consciousness: awake  Complications: No apparent anesthesia complications

## 2012-12-21 NOTE — Progress Notes (Signed)
Catherine [name] is a 37 y.o. G1P0000 at [redacted]w[redacted]d admitted for induction of labor due to [facility].  Subjective: Feeling lots of pressure/urge to push. Denies ha, scotomata, ruq/epigastric pain, n/v.    Objective: BP 155/96  Pulse 99  Temp(Src) 99 F (37.2 C) (Axillary)  Resp 20  Ht 5\' 4"  (1.626 m)  Wt 91.627 kg (202 lb)  BMI 34.66 kg/m2  SpO2 97% I/O last 3 completed shifts: In: -  Out: 200 [Urine:200] Total I/O In: 3038.1 [P.O.:120; I.V.:2763.6; IV Piggyback:154.5] Out: 60 [Urine:60]  CBG (last 3)   Recent Labs  12/20/12 2332 12/21/12 0140 12/21/12 0358  GLUCAP 91 98 99   UO 25cc red urine since 0200  FHT:  FHR: 155 bpm, variability: minimal ,  accelerations:  Abscent,  decelerations:  Present occ mild variable UC:   regular, every 2-5 minutes SVE:   Dilation: 10 Effacement (%): 100 Station: +1;+2 Exam by:: e. poore, rn  Labs: Lab Results  Component Value Date   WBC 10.6* 12/20/2012   HGB 11.3* 12/20/2012   HCT 33.2* 12/20/2012   MCV 83.6 12/20/2012   PLT 150 12/20/2012    Assessment / Plan: IOL d/t GHTN/A1DM, on magnesium for severe-range bp's, has received total of 2 doses iv labetalol, cbg's & bp's stable. UO decreased, but vtx +2. Received 1L bolus earlier. Feeling lots of pressure, to begin pushing. Discussed all w/ Dr. Macon Large, to continue w/ current management.  Labor: Progressing normally Preeclampsia:  on magnesium sulfate and no signs or symptoms of toxicity Fetal Wellbeing:  Category II Pain Control:  Epidural I/D:  amp & gent for chorioamnionitis Anticipated MOD:  NSVD  Marge Duncans 12/21/2012 0540

## 2012-12-21 NOTE — Progress Notes (Signed)
Results for VELISA, REGNIER (MRN [id number]) as of 12/21/2012 14:36  Ref. Range 12/20/2012 18:47 12/21/2012 14:48  WBC Latest Range: 4.0-10.5 K/uL  15.2 (H)  RBC Latest Range: 3.87-5.11 MIL/uL  3.85 (L)  Hemoglobin Latest Range: 12.0-15.0 g/dL  62.9 (L)  HCT Latest Range: 36.0-46.0 %  32.0 (L)  MCV Latest Range: 78.0-100.0 fL  83.1  MCH Latest Range: 26.0-34.0 pg  27.8  MCHC Latest Range: 30.0-36.0 g/dL  52.8  RDW Latest Range: 11.5-15.5 %  14.8  Platelets Latest Range: 150-400 K/uL  159  Dr. Rodman Pickle aware of lab results-ok to d/c epidural.

## 2012-12-21 NOTE — Op Note (Signed)
Catherine [name] PROCEDURE DATE: 12/19/2012 - 12/21/2012  PREOPERATIVE DIAGNOSES: Intrauterine pregnancy at  [redacted]w[redacted]d weeks gestation; failure to progress: arrest of descent  POSTOPERATIVE DIAGNOSES: The same  PROCEDURE: Primary Low Transverse Cesarean Section  SURGEON:  Dr. Despina Hidden  ASSISTANT:  Dr. Jolyn Lent  INDICATIONS: Catherine [name] is a 37 y.o. G1P1001 at [redacted]w[redacted]d here for cesarean section secondary to the indications listed under preoperative diagnoses; please see preoperative note for further details.  The risks of cesarean section were discussed with the patient including but were not limited to: bleeding which may require transfusion or reoperation; infection which may require antibiotics; injury to bowel, bladder, ureters or other surrounding organs; injury to the fetus; need for additional procedures including hysterectomy in the event of a life-threatening hemorrhage; placental abnormalities wth subsequent pregnancies, incisional problems, thromboembolic phenomenon and other postoperative/anesthesia complications.   The patient concurred with the proposed plan, giving informed written consent for the procedure.    FINDINGS:  Viable female infant in cephalic presentation.  Apgars 7 and 8.  Clear amniotic fluid.  Intact placenta, three vessel cord.  Normal uterus, fallopian tubes and ovaries bilaterally.  ANESTHESIA: Epidural INTRAVENOUS FLUIDS: 700 ml ESTIMATED BLOOD LOSS: 650 ml URINE OUTPUT:  125 ml SPECIMENS: Placenta sent to pathology COMPLICATIONS: None immediate  PROCEDURE IN DETAIL:  The patient preoperatively received intravenous antibiotics and had sequential compression devices applied to her lower extremities.  She was then taken to the operating room where the epidural anesthesia was dosed up to surgical level and was found to be adequate. She was then placed in a dorsal supine position with a leftward tilt, and prepped and draped in a sterile manner.  A foley catheter was  placed into her bladder and attached to constant gravity.  After an adequate timeout was performed, a Pfannenstiel skin incision was made with scalpel and carried through to the underlying layer of fascia. The fascia was incised in the midline, and this incision was extended bilaterally using the Mayo scissors.  Kocher clamps were applied to the superior aspect of the fascial incision and the underlying rectus muscles were dissected off sharply. A similar process was carried out on the inferior aspect of the fascial incision. The rectus muscles were separated in the midline bluntly and the peritoneum was entered bluntly. Attention was turned to the lower uterine segment where a low transverse hysterotomy was made with a scalpel and extended bilaterally bluntly.  The infant was successfully delivered, the cord was clamped and cut and the infant was handed over to awaiting neonatology team. Uterine massage was then administered, and the placenta delivered intact with a three-vessel cord. The uterus was delivered then cleared of clot and debris.  The hysterotomy was closed with 0 Monocryl in a running locked fashion, and an imbricating layer was also placed with 0 Monocryl. The uterus was reintroduced to the pelvis and sutures remained hemostatic. The pelvis was cleared of all clot and debris. Hemostasis was confirmed on all surfaces. The pelvis was irrigated three times. The peritoneum and the muscles were reapproximated using 0 chromic interrupted stitches. The fascia was then closed using 0 Vicryl in a running fashion.  The subcutaneous layer was irrigated,  and 20 ml of 1.3% Exparel was injected subcutaneously around the incision.  The skin was closed with a 4-0 Vicryl subcuticular stitch. The patient tolerated the procedure well. Sponge, lap, instrument and needle counts were correct x 2.  She was taken to the recovery room in stable condition.   Casimiro Needle  Glyn Ade, MD OB Fellow

## 2012-12-22 ENCOUNTER — Encounter (HOSPITAL_COMMUNITY): Payer: Self-pay | Admitting: Obstetrics & Gynecology

## 2012-12-22 LAB — CBC
HCT: 26.8 % — ABNORMAL LOW (ref 36.0–46.0)
Hemoglobin: 9 g/dL — ABNORMAL LOW (ref 12.0–15.0)
MCH: 28 pg (ref 26.0–34.0)
MCHC: 33.6 g/dL (ref 30.0–36.0)
MCV: 83.5 fL (ref 78.0–100.0)
Platelets: 152 10*3/uL (ref 150–400)
RBC: 3.21 MIL/uL — ABNORMAL LOW (ref 3.87–5.11)
WBC: 11.3 10*3/uL — ABNORMAL HIGH (ref 4.0–10.5)

## 2012-12-22 MED ORDER — MEASLES, MUMPS & RUBELLA VAC ~~LOC~~ INJ: 0.5000 mL | INJECTION | Freq: Once | SUBCUTANEOUS | Status: DC

## 2012-12-22 NOTE — Progress Notes (Signed)
Ur chart review completed.  

## 2012-12-22 NOTE — Anesthesia Postprocedure Evaluation (Signed)
  Anesthesia Post-op Note  Patient: Catherine [name]  Procedure(s) Performed: Procedure(s): CESAREAN SECTION (N/A)  Patient Location: ICU  Anesthesia Type:Epidural  Level of Consciousness: awake, alert  and oriented  Airway and Oxygen Therapy: Patient Spontanous Breathing  Post-op Pain: mild  Post-op Assessment: Patient's Cardiovascular Status Stable, Respiratory Function Stable, No headache, No backache, No residual numbness and No residual motor weakness  Post-op Vital Signs: stable  Complications: No apparent anesthesia complications

## 2012-12-22 NOTE — Progress Notes (Signed)
Subjective: Postpartum Day 1: Cesarean Delivery Patient reports incisional pain and tolerating PO.  Appropriate post op complaints  Objective:   Intake/Output Summary (Last 24 hours) at 12/22/12 0744 Last data filed at 12/22/12 0700  Gross per 24 hour  Intake 2506.88 ml  Output   1850 ml  Net 656.88 ml    Filed Vitals:   12/22/12 0400 12/22/12 0500 12/22/12 0600 12/22/12 0700  BP: 105/64 131/86 122/80 115/65  Pulse:   89 95  Temp: 98 F (36.7 C)     TempSrc: Oral     Resp:      Height:      Weight:      SpO2:   99% 93%    Vital signs in last 24 hours: Temp:  [97.3 F (36.3 C)-98.9 F (37.2 C)] 98 F (36.7 C) (10/13 0400) Pulse Rate:  [71-112] 95 (10/13 0700) Resp:  [16-20] 16 (10/13 0000) BP: (103-167)/(59-140) 115/65 mmHg (10/13 0700) SpO2:  [93 %-100 %] 93 % (10/13 0700)  Physical Exam:  General: alert, cooperative and no distress Lochia: appropriate Uterine Fundus: firm Incision: no significant drainage DVT Evaluation: No evidence of DVT seen on physical exam.   Recent Labs  12/21/12 1448 12/22/12 0507  HGB 10.7* 9.0*  HCT 32.0* 26.8*    Assessment/Plan: Status post Cesarean section. Doing well postoperatively.  Continue current care, continue zosyn for now due to intrapartum chorio and Caesarean Transfer to floor later todaY AFTER MAG OFF.  EURE,LUTHER H 12/22/2012, 7:43 AM

## 2012-12-22 NOTE — Lactation Note (Signed)
This note was copied from the chart of Catherine [name]. Lactation Consultation Note    Initial consult with this mom and term baby, now 28 hours post partum. Baby was tongue sucking, so mom was having trouble getting her latched today. She latched easily after birth, according to mom. Mom is in AICU, on magnesium drip. I did suck training and gum massage with gloved finger, with colostrum on it. I then again assisted mom with latch. Baby open better, and latched beyond the nipple, (not as deep as she could have), with strong, rhythmic suckles, and some audible swallows. Football hold and skin to skin shown to mom. Lactation services reviewed with mom. Mom knows to calllf ro questions/concerns.  Patient Name: Catherine [name] [id number] Date: 12/22/2012 Reason for consult: Initial assessment   Maternal Data Formula Feeding for Exclusion: Yes Reason for exclusion: Admission to Intensive Care Unit (ICU) post-partum Infant to breast within first hour of birth: Yes Has patient been taught Hand Expression?: Yes Does the patient have breastfeeding experience prior to this delivery?: No  Feeding Feeding Type: Breast Fed Length of feed: 15 min (still feeding when I left the room)  LATCH Score/Interventions Latch: Repeated attempts needed to sustain latch, nipple held in mouth throughout feeding, stimulation needed to elicit sucking reflex. (baby tongue sucing - suck training and gum massage done ) Intervention(s): Adjust position;Assist with latch;Breast massage;Breast compression  Audible Swallowing: A few with stimulation Intervention(s): Skin to skin;Hand expression;Alternate breast massage  Type of Nipple: Everted at rest and after stimulation  Comfort (Breast/Nipple): Soft / non-tender     Hold (Positioning): Assistance needed to correctly position infant at breast and maintain latch. Intervention(s): Breastfeeding basics reviewed;Support Pillows;Position options;Skin to  skin  LATCH Score: 7  Lactation Tools Discussed/Used     Consult Status Consult Status: Follow-up Date: 12/23/12 Follow-up type: In-patient    Alfred Levins 12/22/2012, 3:28 PM

## 2012-12-23 MED ORDER — MEASLES, MUMPS & RUBELLA VAC ~~LOC~~ INJ
0.5000 mL | INJECTION | Freq: Once | SUBCUTANEOUS | Status: AC
  Administered 2012-12-24: 0.5 mL via SUBCUTANEOUS
  Filled 2012-12-23: qty 0.5

## 2012-12-23 NOTE — Progress Notes (Signed)
Clinical Social Work Department PSYCHOSOCIAL ASSESSMENT - MATERNAL/CHILD 12/23/2012  Patient:  Catherine [name], Catherine [name]  Account Number:  [id number]  Admit Date:  12/19/2012  Marjo Bicker Name:   Corliss Skains    Clinical Social Worker:  Lulu Riding, [place]   Date/Time:  12/23/2012 11:00 AM  Date Referred:  12/23/2012   Referral source  CN     Referred reason  [facility]   Other referral source:    I:  FAMILY / HOME ENVIRONMENT Child's legal guardian:  PARENT  Guardian - Name Guardian - Age Guardian - Address  Lavida Patch [address], [place], [place], [place] [address]  Jeannetta Nap  same   Other household support members/support persons Other support:   MOB states she has a great support system at home.    II  PSYCHOSOCIAL DATA Information Source:  Patient Interview  Event organiser Employment:   MOB was working for [employer], but states she plans to take an undetermined amount of time off for maternity leave.  FOB works for [employer] resources:  [employer] If [employer]:  [place] Other  [facility]  Food Stamps   School / Grade:   Maternity Care Coordinator / Child Services Coordination / Early Interventions:  Cultural issues impacting care:   None stated    III  STRENGTHS Strengths  Adequate Resources  Compliance with medical plan  Home prepared for Child (including basic supplies)  Supportive family/friends   Strength comment:    IV  RISK FACTORS AND CURRENT PROBLEMS Current Problem:  None     V  SOCIAL WORK ASSESSMENT  CSW met with MOB in her first floor room to complete assessment due to [facility].  MOB was breast feeding infant when CSW arrived, but stated she welcomed CSW into her room and stated we could talk at this time.  She was pleasant and states she is tired, but doing well.  She reports having good support and everything she needs for baby at home.  CSW discussed signs and symptoms of PPD and MOB  states she has no emotional concerns at this time.  She agrees to contact her doctor if concerns arise.  CSW asked about MOB's late entry to [facility] and MOB states she did not know she was pregnant until July.  She state she was told she could not get pregnant and had never become pregnant before and therefore attributed all of her symptoms to other things.  She states it took a month to get an appointment once she realized she was pregnant.  CSW explained hospital drug screen policy and MOB has no concerns.  CSW has no social concerns at this time.  MOB thanked CSW for the visit and has no questions or needs at this time.   VI SOCIAL WORK PLAN Social Work Plan  No Further Intervention Required / No Barriers to Discharge   Type of pt/family education:   Hospital drug screen policy  PPD signs and symptoms   If child protective services report - county:   If child protective services report - date:   Information/referral to community resources comment:   No referral needs identified   Other social work plan:   CSW will monitor meconium drug screen results

## 2012-12-23 NOTE — Lactation Note (Signed)
This note was copied from the chart of Catherine Khamil Lamica. Lactation Consultation Note RN notified LC that mom is requesting assistance with feeding. Mom holding baby STS when I enter room; mom states she is very sore from the c/s site and has difficulty positioning baby. Discussed football hold and assisted mom to place baby in football hold. Mom states she is not sure about hand expression; demonstrated hand expression; large drops of colostrum easily expressed.  Baby has deep latch with rhythmic sucking and audible swallowing; mom states she is comfortable. Mom wants to be able to pump breast milk so someone else can feed baby and she can have a break. Discussed colostrum vs br milk and that it can be difficult this early to pump a large amount of milk. Inst mom to hand express instead and her family member can cup or spoon feed baby if needed. Enc mom to continue frequent STS and cue based breast feeding, and that pumping will get easier as her volume increases.  Enc mom to call for help if needed.    Patient Name: Catherine [name] Date: 12/23/2012 Reason for consult: Follow-up assessment   Maternal Data    Feeding Feeding Type: Breast Fed Length of feed: 15 min  LATCH Score/Interventions Latch: Grasps breast easily, tongue down, lips flanged, rhythmical sucking.  Audible Swallowing: Spontaneous and intermittent  Type of Nipple: Everted at rest and after stimulation  Comfort (Breast/Nipple): Soft / non-tender     Hold (Positioning): Assistance needed to correctly position infant at breast and maintain latch.  LATCH Score: 9  Lactation Tools Discussed/Used     Consult Status Consult Status: PRN    Lenard Forth 12/23/2012, 10:17 AM

## 2012-12-23 NOTE — Progress Notes (Signed)
Mom requested Enfamil formula for infant. Education completed and explained WIC does not provide Enfamil. Mom insisted on Enfamil for infant. Will continue to monitor.

## 2012-12-23 NOTE — Progress Notes (Signed)
Subjective: Postpartum Day 2: Cesarean Delivery Patient reports tolerating PO, + flatus and no problems voiding.  Pt doing well post-op, up and ambulating; off mag and zosyn yesterday; Transitioned out of AICU  Objective: Vital signs in last 24 hours: Temp:  [98 F (36.7 C)-98.7 F (37.1 C)] 98.4 F (36.9 C) (10/14 0530) Pulse Rate:  [80-98] 88 (10/14 0530) Resp:  [16-20] 20 (10/14 0530) BP: (108-144)/(67-88) 138/88 mmHg (10/14 0530) SpO2:  [97 %-99 %] 97 % (10/14 0530)  Physical Exam:  General: alert, cooperative and no distress Lochia: appropriate Uterine Fundus: firm, at umbilicus Incision: healing well, no significant drainage, no significant erythema, dressing in tact DVT Evaluation: No evidence of DVT seen on physical exam.   Recent Labs  12/21/12 1448 12/22/12 0507  HGB 10.7* 9.0*  HCT 32.0* 26.8*    Assessment/Plan: Status post Cesarean section. Doing well postoperatively.  Systolic ranges 144-108 Continue current care, plan to d/c home 24 hrs off mag, likely plan for d/c tmw. Plan is for breast feeding with pumping, undecided abt contraception Will f/up postpartum at [facility] 12/23/2012, 7:58 AM  I have seen this patient and agree with the above resident's note.  LEFTWICH-KIRBY, Carys Malina Certified Nurse-Midwife

## 2012-12-24 MED ORDER — OXYCODONE-ACETAMINOPHEN 5-325 MG PO TABS: 1.0000 | ORAL_TABLET | ORAL | Status: DC | PRN

## 2012-12-24 MED ORDER — IBUPROFEN 600 MG PO TABS: 600.0000 mg | ORAL_TABLET | Freq: Four times a day (QID) | ORAL | Status: DC

## 2012-12-24 NOTE — Lactation Note (Signed)
This note was copied from the chart of Catherine [name]. Lactation Consultation Note  Patient Name: Catherine [name] Date: 12/24/2012 Reason for consult: Follow-up assessment Mother just pumped and expressed 30 ml. Prior to pumping, fed the baby formula because she said she was getting nipple soreness. Baby is now sleeping. Offered to assist with latch if mother will call. Breast are filling, nipples are red with small skin crack at base of nipples, no bleeding noted. Areola tissue is soft and skin is dry in areas. Mother was given and instructed on comfort gels to promote healing and comfort of nipples. Lanolin given for mother to apply after gels for dry skin. Mother instructed my use olive oil if prefers, once home. Discussed that pain with latch can also occur with baby getting narrow formula nipples and pacifiers. Mother states the baby has not latched as well since introducing the bottle. Mother plans to enroll in [facility]. Instructed on lactation support services post discharge.  Maternal Data    Feeding Feeding Type: Breast Fed Length of feed: 10 min  LATCH Score/Interventions                      Lactation Tools Discussed/Used     Consult Status Consult Status: Complete Follow-up type: Call as needed    Omar Person 12/24/2012, 12:44 PM

## 2012-12-24 NOTE — Discharge Summary (Signed)
Attestation of Attending Supervision of Advanced Practitioner (CNM/NP): Evaluation and management procedures were performed by the Advanced Practitioner under my supervision and collaboration.  I have reviewed the Advanced Practitioner's note and chart, and I agree with the management and plan.  HARRAWAY-SMITH, Marney Treloar 7:20 AM

## 2012-12-24 NOTE — Discharge Summary (Signed)
Obstetric Discharge Summary Reason for Admission: induction of labor for Gestational hypertension and Gestational Diabetes Prenatal Procedures: NST Intrapartum Procedures: cesarean: low cervical, transverse Postpartum Procedures: none Complications-Operative and Postpartum: none Hemoglobin  Date Value Range Status  12/22/2012 9.0* 12.0 - 15.0 g/dL Final  03/17/1094 04.5   Final     HCT  Date Value Range Status  12/22/2012 26.8* 36.0 - 46.0 % Final  10/13/2012 33   Final  Hospital course: Catherine [name] is a 37 y.o. female G1P0000 with IUP at [redacted]w[redacted]d by [place] at 29wks presenting for IOL for gestational HTN. Pt states she has been having irregular, every 2-5 minutes contractions lasting anywhere from 20-40sec , associated with scant staining vaginal bleeding. Membranes are intact, with active fetal movement. Currently rates pain 9.5/10.  [facility] 29 wks. Pregnancy complicated by A1GDM and gestHTN.   PROCEDURE DATE: 12/19/2012 - 12/21/2012  PREOPERATIVE DIAGNOSES: Intrauterine pregnancy at [redacted]w[redacted]d weeks gestation; failure to progress: arrest of descent  POSTOPERATIVE DIAGNOSES: The same  PROCEDURE: Primary Low Transverse Cesarean Section  SURGEON: Dr. Despina Hidden  ASSISTANT: Dr. Jolyn Lent  INDICATIONS: Catherine [name] is a 37 y.o. G1P1001 at [redacted]w[redacted]d here for cesarean section secondary to the indications listed under preoperative diagnoses; please see preoperative note for further details. The risks of cesarean section were discussed with the patient including but were not limited to: bleeding which may require transfusion or reoperation; infection which may require antibiotics; injury to bowel, bladder, ureters or other surrounding organs; injury to the fetus; need for additional procedures including hysterectomy in the event of a life-threatening hemorrhage; placental abnormalities wth subsequent pregnancies, incisional problems, thromboembolic phenomenon and other postoperative/anesthesia  complications. The patient concurred with the proposed plan, giving informed written consent for the procedure.  FINDINGS: Viable female infant in cephalic presentation. Apgars 7 and 8. Clear amniotic fluid. Intact placenta, three vessel cord. Normal uterus, fallopian tubes and ovaries bilaterally.  ANESTHESIA: Epidural  INTRAVENOUS FLUIDS: 700 ml  ESTIMATED BLOOD LOSS: 650 ml  URINE OUTPUT: 125 ml  SPECIMENS: Placenta sent to pathology  COMPLICATIONS: None immediate   Results for Catherine, CHARTERS (MRN [id number]) as of 12/24/2012 06:54  Ref. Range 12/21/2012 01:40 12/21/2012 03:58 12/21/2012 05:33 12/21/2012 09:52 12/21/2012 12:18  Glucose-Capillary Latest Range: 70-99 mg/dL 98 99 782 (H) 956 (H) 213 (H)   Has done well postoperatively.  Taking po's well, no problems with voiding. Pain well controlled Ready to go home   Physical Exam:  Filed Vitals:   12/23/12 1711 12/23/12 1721 12/23/12 2054 12/24/12 0540  BP: 151/93 149/92 138/80 147/92  Pulse: 91 88 87 82  Temp:  98.8 F (37.1 C)  98.1 F (36.7 C)  TempSrc:  Oral  Oral  Resp:  20  20  Height:      Weight:      SpO2:        General: alert and no distress Lochia: appropriate Uterine Fundus: firm Incision: healing well, no significant drainage DVT Evaluation: No evidence of DVT seen on physical exam.  Discharge Diagnoses: Term Pregnancy-delivered and Gestational Hypertension  Discharge Information: Date: 12/24/2012 Activity: pelvic rest and No driving for 2 weeks Diet: routine Medications: PNV, Ibuprofen and Percocet Condition: stable Instructions: refer to practice specific booklet Discharge to: home   Newborn Data: Live born female  Birth Weight: 8 lb 9.7 oz (3905 g) APGAR: 7, 8  Home with mother. Return to clinic in 4 weeks Will need postpartum 2hrGTT BPs improved. Will not discharge on medication for now. Smart  Start nurse to check BP  [facility] 12/24/2012, 6:48 AM

## 2012-12-27 ENCOUNTER — Inpatient Hospital Stay (HOSPITAL_COMMUNITY)
Admission: AD | Admit: 2012-12-27 | Discharge: 2012-12-28 | DRG: 776 | Disposition: A | Discharge status: Home / Self Care | Payer: Medicaid Other | Source: Ambulatory Visit | Attending: Family Medicine | Admitting: Family Medicine

## 2012-12-27 ENCOUNTER — Encounter (HOSPITAL_COMMUNITY): Payer: Self-pay | Admitting: Family

## 2012-12-27 DIAGNOSIS — O9081 Anemia of the puerperium: Secondary | ICD-10-CM | POA: Diagnosis present

## 2012-12-27 DIAGNOSIS — IMO0002 Reserved for concepts with insufficient information to code with codable children: Principal | ICD-10-CM | POA: Diagnosis present

## 2012-12-27 DIAGNOSIS — D649 Anemia, unspecified: Secondary | ICD-10-CM | POA: Diagnosis present

## 2012-12-27 DIAGNOSIS — O1495 Unspecified pre-eclampsia, complicating the puerperium: Secondary | ICD-10-CM

## 2012-12-27 LAB — CBC WITH DIFFERENTIAL/PLATELET
Basophils Relative: 1 % (ref 0–1)
Eosinophils Relative: 5 % (ref 0–5)
HCT: 25.6 % — ABNORMAL LOW (ref 36.0–46.0)
Lymphocytes Relative: 16 % (ref 12–46)
Lymphs Abs: 1.3 10*3/uL (ref 0.7–4.0)
MCH: 27.9 pg (ref 26.0–34.0)
MCV: 83.9 fL (ref 78.0–100.0)
Monocytes Absolute: 0.6 10*3/uL (ref 0.1–1.0)
Neutro Abs: 6.2 10*3/uL (ref 1.7–7.7)
RBC: 3.05 MIL/uL — ABNORMAL LOW (ref 3.87–5.11)
RDW: 14 % (ref 11.5–15.5)
WBC: 8.2 10*3/uL (ref 4.0–10.5)

## 2012-12-27 LAB — COMPREHENSIVE METABOLIC PANEL
AST: 38 U/L — ABNORMAL HIGH (ref 0–37)
Albumin: 2.3 g/dL — ABNORMAL LOW (ref 3.5–5.2)
Alkaline Phosphatase: 134 U/L — ABNORMAL HIGH (ref 39–117)
BUN: 8 mg/dL (ref 6–23)
CO2: 23 mEq/L (ref 19–32)
Calcium: 8.5 mg/dL (ref 8.4–10.5)
Chloride: 105 mEq/L (ref 96–112)
GFR calc Af Amer: >90 mL/min (ref >90)
Potassium: 4.2 mEq/L (ref 3.5–5.1)
Total Bilirubin: 0.2 mg/dL — ABNORMAL LOW (ref 0.3–1.2)
Total Protein: 5.8 g/dL — ABNORMAL LOW (ref 6.0–8.3)

## 2012-12-27 LAB — URINALYSIS, ROUTINE W REFLEX MICROSCOPIC
Bilirubin Urine: NEGATIVE
Glucose, UA: NEGATIVE mg/dL
Ketones, ur: NEGATIVE mg/dL
Nitrite: NEGATIVE
Specific Gravity, Urine: 1.015 (ref 1.005–1.030)
pH: 7 (ref 5.0–8.0)

## 2012-12-27 LAB — PROTEIN / CREATININE RATIO, URINE
Protein Creatinine Ratio: 0.13 (ref 0.00–0.15)
Total Protein, Urine: 10 mg/dL

## 2012-12-27 LAB — URINE MICROSCOPIC-ADD ON

## 2012-12-27 MED ORDER — SENNOSIDES-DOCUSATE SODIUM 8.6-50 MG PO TABS
2.0000 | ORAL_TABLET | ORAL | Status: DC
  Administered 2012-12-28: 2 via ORAL
  Filled 2012-12-27: qty 2

## 2012-12-27 MED ORDER — PRENATAL MULTIVITAMIN CH
1.0000 | ORAL_TABLET | Freq: Every day | ORAL | Status: DC
  Administered 2012-12-28: 1 via ORAL
  Filled 2012-12-27: qty 1

## 2012-12-27 MED ORDER — ENALAPRIL MALEATE 5 MG PO TABS
5.0000 mg | ORAL_TABLET | ORAL | Status: AC
  Administered 2012-12-27: 5 mg via ORAL
  Filled 2012-12-27: qty 1

## 2012-12-27 MED ORDER — DIPHENHYDRAMINE HCL 25 MG PO CAPS: 25.0000 mg | ORAL_CAPSULE | Freq: Four times a day (QID) | ORAL | Status: DC | PRN

## 2012-12-27 MED ORDER — ENALAPRIL MALEATE 5 MG PO TABS
5.0000 mg | ORAL_TABLET | Freq: Every day | ORAL | Status: DC
  Administered 2012-12-27: 5 mg via ORAL
  Filled 2012-12-27 (×2): qty 1

## 2012-12-27 MED ORDER — SIMETHICONE 80 MG PO CHEW
80.0000 mg | CHEWABLE_TABLET | ORAL | Status: DC
  Filled 2012-12-27: qty 1

## 2012-12-27 MED ORDER — HYDROCHLOROTHIAZIDE 25 MG PO TABS
25.0000 mg | ORAL_TABLET | ORAL | Status: AC
  Administered 2012-12-27: 25 mg via ORAL
  Filled 2012-12-27: qty 1

## 2012-12-27 MED ORDER — MENTHOL 3 MG MT LOZG: 1.0000 | LOZENGE | OROMUCOSAL | Status: DC | PRN

## 2012-12-27 MED ORDER — SIMETHICONE 80 MG PO CHEW
80.0000 mg | CHEWABLE_TABLET | Freq: Three times a day (TID) | ORAL | Status: DC
  Administered 2012-12-27 – 2012-12-28 (×6): 80 mg via ORAL
  Filled 2012-12-27 (×4): qty 1

## 2012-12-27 MED ORDER — ONDANSETRON HCL 4 MG/2ML IJ SOLN: 4.0000 mg | INTRAMUSCULAR | Status: DC | PRN

## 2012-12-27 MED ORDER — DIBUCAINE 1 % RE OINT: 1.0000 "application " | TOPICAL_OINTMENT | RECTAL | Status: DC | PRN

## 2012-12-27 MED ORDER — SIMETHICONE 80 MG PO CHEW: 80.0000 mg | CHEWABLE_TABLET | ORAL | Status: DC | PRN

## 2012-12-27 MED ORDER — LABETALOL HCL 5 MG/ML IV SOLN
20.0000 mg | Freq: Once | INTRAVENOUS | Status: AC
  Administered 2012-12-27: 20 mg via INTRAVENOUS
  Filled 2012-12-27: qty 4

## 2012-12-27 MED ORDER — WITCH HAZEL-GLYCERIN EX PADS: 1.0000 "application " | MEDICATED_PAD | CUTANEOUS | Status: DC | PRN

## 2012-12-27 MED ORDER — LANOLIN HYDROUS EX OINT: 1.0000 "application " | TOPICAL_OINTMENT | CUTANEOUS | Status: DC | PRN

## 2012-12-27 MED ORDER — FERROUS SULFATE 325 (65 FE) MG PO TABS
325.0000 mg | ORAL_TABLET | Freq: Two times a day (BID) | ORAL | Status: DC
  Administered 2012-12-27 – 2012-12-28 (×3): 325 mg via ORAL
  Filled 2012-12-27 (×3): qty 1

## 2012-12-27 MED ORDER — ZOLPIDEM TARTRATE 5 MG PO TABS: 5.0000 mg | ORAL_TABLET | Freq: Every evening | ORAL | Status: DC | PRN

## 2012-12-27 MED ORDER — ONDANSETRON HCL 4 MG PO TABS: 4.0000 mg | ORAL_TABLET | ORAL | Status: DC | PRN

## 2012-12-27 MED ORDER — HYDRALAZINE HCL 20 MG/ML IJ SOLN
10.0000 mg | Freq: Once | INTRAMUSCULAR | Status: AC
  Administered 2012-12-27: 10 mg via INTRAVENOUS
  Filled 2012-12-27: qty 1

## 2012-12-27 MED ORDER — OXYCODONE-ACETAMINOPHEN 5-325 MG PO TABS
1.0000 | ORAL_TABLET | ORAL | Status: DC | PRN
  Administered 2012-12-27 – 2012-12-28 (×4): 2 via ORAL
  Filled 2012-12-27 (×4): qty 2

## 2012-12-27 NOTE — MAU Note (Signed)
37 yo, G1P1 s/p postpartum c/s on 12/19/2012 presents to MAU with c/o elevated BPs at [employer] RN home visit yesterday. Reports 160/92. Denies HA, visual disturbances.  Reports postpartum bleeding is subsiding.

## 2012-12-27 NOTE — H&P (Addendum)
I spoke with and examined patient and agree with resident's note and plan of care.  Severe range bp's and elevated LFTs, otherwise normal exam. Admitting for bp control w/ vasotec per consult w/ Dr. Shawnie Pons. Had 24hr pp magnesium on 12/21/12, will not repeat at this time. Cheral Marker, CNM, WHNP-BC 12/27/2012 1:16 PM

## 2012-12-27 NOTE — H&P (Addendum)
Please see MAU note for postpartum admit H&P Pt with postpartum pre-e admitted for serial BP checks and CMET/CBC tmw am Expect stay for 24 hrs Likely will need d/c on antihypertensives  Charlane Ferretti, MD Family Medicine PGY-1 Please page or call with questions  HPI  Ms Catherine [name] is a 37y.o G1P1001 who is POD#6 for LTCS. Originally planned for IOL 2/2 gestational HTN but underwent CS 2/2 failure to descent/progress. She presents today for elevated BPs noted by home nurse, reportedly 160/92. She denies headache/n/v/chest pain/palps/SOB/blurry vision. Has otherwise done well, with moderate incisional pain, minimal vaginal bleeding. Has been using percocet up to 4 times per day and ibuprofen for breakthrough. Started icing last night which seems to have helped considerably.  Of note pt had severe range pressures postpartum received mag X24 hrs. Was not sent home on BPs meds given BPs on day of d/c 140s/90s.  Pt unclear of BPs prior to pregnancy bc has not had consistent PCP visits. Believes she may have had isolated high BP readings.  OB History    Grav  Para  Term  Preterm  Abortions  TAB  SAB  Ect  Mult  Living    1  1  1   0  0  0  0  0  0  1      Past Medical History   Diagnosis  Date   .  Gestational diabetes      diet controlled   .  Medical history non-contributory    .  Gestational hypertension    .  Advanced maternal age (AMA) in pregnancy    .  Late prenatal care     Past Surgical History   Procedure  Laterality  Date   .  Laparoscopic ovarian   2004   .  Cesarean section  N/A  12/21/2012     Procedure: CESAREAN SECTION; Surgeon: Lazaro Arms, MD; Location: [facility]; Service: Obstetrics; Laterality: N/A;    Family History   Problem  Relation  Age of Onset   .  Alcohol abuse  Neg Hx    .  Arthritis  Neg Hx    .  Asthma  Neg Hx    .  Birth defects  Neg Hx    .  Cancer  Neg Hx    .  COPD  Neg Hx    .  Depression  Neg Hx    .  Diabetes  Neg Hx    .  Drug abuse  Neg  Hx    .  Early death  Neg Hx    .  Hearing loss  Neg Hx    .  Heart disease  Neg Hx    .  Hyperlipidemia  Neg Hx    .  Hypertension  Neg Hx    .  Kidney disease  Neg Hx    .  Learning disabilities  Neg Hx    .  Mental illness  Neg Hx    .  Mental retardation  Neg Hx    .  Miscarriages / Stillbirths  Neg Hx    .  Stroke  Neg Hx    .  Vision loss  Neg Hx     History   Substance Use Topics   .  Smoking status:  Never Smoker   .  Smokeless tobacco:  Never Used   .  Alcohol Use:  No    Allergies: No Known Allergies  Prescriptions prior to admission  Medication  Sig  Dispense  Refill   .  ACCU-CHEK FASTCLIX LANCETS MISC  1 Units by Does not apply route 4 (four) times daily - after meals and at bedtime.  360 each  1   .  cephALEXin (KEFLEX) 500 MG capsule  Take 1 capsule (500 mg total) by mouth 2 (two) times daily.  14 capsule  0   .  glucose blood (ACCU-CHEK SMARTVIEW) test strip  Use as instructed  100 each  12   .  hydrOXYzine (VISTARIL) 50 MG capsule  Take BID as needed for early contractions  30 capsule  0   .  ibuprofen (ADVIL,MOTRIN) 600 MG tablet  Take 1 tablet (600 mg total) by mouth every 6 (six) hours.  30 tablet  0   .  oxyCODONE-acetaminophen (PERCOCET/ROXICET) 5-325 MG per tablet  Take 1-2 tablets by mouth every 4 (four) hours as needed.  40 tablet  0   .  Prenatal Vit-Fe Fumarate-FA (PRENATAL MULTIVITAMIN) TABS tablet  Take 1 tablet by mouth daily at 12 noon.  30 tablet  3   .  ranitidine (ZANTAC) 150 MG tablet  Take 150 mg by mouth daily.      Review of Systems  Constitutional: Negative for fever and diaphoresis.  HENT: Negative for congestion, ear pain, nosebleeds and sore throat.  Eyes: Negative for blurred vision, double vision, photophobia and pain.  Respiratory: Negative for cough, shortness of breath and wheezing.  Cardiovascular: Negative for chest pain, palpitations and leg swelling.  Gastrointestinal: Positive for abdominal pain. Negative for heartburn, nausea  and vomiting.  Genitourinary: Negative for dysuria, urgency and frequency.  Musculoskeletal: Negative for myalgias.  Skin: Negative for rash.  Neurological: Negative for dizziness, seizures, loss of consciousness, weakness and headaches.  Endo/Heme/Allergies: Does not bruise/bleed easily.  Psychiatric/Behavioral: Negative for depression.   Physical Exam   Blood pressure 161/104, pulse 78, resp. rate 18, currently breastfeeding.  Physical Exam  Constitutional: She is oriented to person, place, and time. She appears well-developed and well-nourished.  HENT:  Head: Normocephalic and atraumatic.  Mouth/Throat: Oropharynx is clear and moist.  Eyes: EOM are normal. Pupils are equal, round, and reactive to light. No scleral icterus. No visual deficits Neck: Normal range of motion. Neck supple. No thyromegaly present.  Cardiovascular: Normal rate and regular rhythm.  No murmur heard.  Respiratory: Effort normal and breath sounds normal. No respiratory distress.  GI: Soft. Bowel sounds are normal. She exhibits no distension. There is tenderness (appropriate suprapubic tenderness near incisional site, no RUQ/epigastric tenderness).  Musculoskeletal: Normal range of motion. She exhibits no edema.  Lymphadenopathy:  She has no cervical adenopathy.  Neurological: She is alert and oriented to person, place, and time. She displays normal reflexes. She exhibits normal muscle tone.  Skin: Skin is warm and dry. No erythema. 2+ pitting edema bilaterally  MAU Course   Procedures  MDM  Serial BPs  Protein/Creatinine Ratio  CBC with diff  CMET  Hydralazine X1 10mg   Assessment and Plan   Ms Catherine [name] is a G1P1 with hx of gestational htn who presents pod#6 with severe range pressures and findings of transaminitis on labs, not improved after 10mg  IV hydralazine.   1. Postpartum pre-E- has severe features with diastolics around 110 and CMET(38/70), no RUQ pain,epigastric pain, headaches/n/v. Admit for  serial BP checks; expect pr/cr ratio to be elevated given dirty urine (ideally would have been straight cath'd) but 0.13 today; questionable chronic hypertensive? Does not follow reg with PCP enough to  trend.  -s/p 10iv hydralazine  -daily vasotec 5mg , will not affect lactation  -vs q4  -cmet, cbc with diff tmw am  -monitor dtrs, clonus  -hold on mag for now given that pt received 24hr treatment post partum for severe pressures  -will need to be sent home on BP regimen with recheck in 2-3 weeks  2. Pain control s/p LTCS- pressure dressing to be removed today  -ice packs to suprapubic region  -avoid NSAIDs given elevated BPs  -avoid extra tylenol given elevated LFTs  -will continue with percocet for now, may need to alter pending morning cmet  3. Anemia- hgb 8.5 today from 9.0 10/13 and 10.7 on 10/12  -iron supplement BID  -will hold on hemolysis labs at this time  -plt stable  4. Postpartum care- continues to breast feed and pump  -undecided abt contraception

## 2012-12-27 NOTE — H&P (Addendum)
I spoke with and examined patient and agree with resident's note and plan of care.  Pt POD#6 s/p PLTCS for FTD w/ severe-range bp's and elevated LFTs, asymptomatic. Denies ha, scotomata, ruq/epigastric pain, n/v. RUQ/epigastrum non-tender, DTRs wnl, no clonus, 2+ BLE edema. Pt w/ appropriate incisional pain/tenderness. Received 1 dose of iv hydralazine in MAU w/o change in BP. Breastfeeding. Being admitted for bp control w/ po vasotec per consult w/ Dr. Shawnie Pons. Pt received magnesium intrapartum d/t severe range pressures and for 24hr pp, so will not repeat at this time.  Cheral Marker, CNM, [facility] 12/27/2012 7:55 PM

## 2012-12-27 NOTE — MAU Provider Note (Signed)
Chart reviewed and agree with management and plan.  

## 2012-12-27 NOTE — MAU Provider Note (Signed)
History     CSN: [id number]  Arrival date and time: 12/27/12 1009   None     Chief Complaint  Patient presents with  . Hypertension   HPI  Ms Catherine [name] is a 37y.o G1P1001 who is POD#6 for LTCS. Originally planned for IOL 2/2 gestational HTN but underwent CS 2/2 failure to descent/progress. She presents today for elevated BPs noted by home nurse, reportedly 160/92. She denies headache/n/v/chest pain/palps/SOB/blurry vision. Has otherwise done well, with moderate incisional pain, minimal vaginal bleeding. Has been using percocet up to 4 times per day and ibuprofen for breakthrough. Started icing last night which seems to have helped considerably.  Of note pt had severe range pressures postpartum received mag X24 hrs. Was not sent home on BPs meds given BPs on day of d/c 140s/90s.  Pt unclear of BPs prior to pregnancy bc has not had consistent PCP visits. Believes she may have had isolated high BP readings.  OB History   Grav Para Term Preterm Abortions TAB SAB Ect Mult Living   1 1 1  0 0 0 0 0 0 1      Past Medical History  Diagnosis Date  . Gestational diabetes     diet controlled  . Medical history non-contributory   . Gestational hypertension   . Advanced maternal age (AMA) in pregnancy   . Late prenatal care     Past Surgical History  Procedure Laterality Date  . Laparoscopic ovarian  2004  . Cesarean section N/A 12/21/2012    Procedure: CESAREAN SECTION;  Surgeon: Lazaro Arms, MD;  Location: [facility];  Service: Obstetrics;  Laterality: N/A;    Family History  Problem Relation Age of Onset  . Alcohol abuse Neg Hx   . Arthritis Neg Hx   . Asthma Neg Hx   . Birth defects Neg Hx   . Cancer Neg Hx   . COPD Neg Hx   . Depression Neg Hx   . Diabetes Neg Hx   . Drug abuse Neg Hx   . Early death Neg Hx   . Hearing loss Neg Hx   . Heart disease Neg Hx   . Hyperlipidemia Neg Hx   . Hypertension Neg Hx   . Kidney disease Neg Hx   . Learning disabilities Neg  Hx   . Mental illness Neg Hx   . Mental retardation Neg Hx   . Miscarriages / Stillbirths Neg Hx   . Stroke Neg Hx   . Vision loss Neg Hx     History  Substance Use Topics  . Smoking status: Never Smoker   . Smokeless tobacco: Never Used  . Alcohol Use: No    Allergies: No Known Allergies  Prescriptions prior to admission  Medication Sig Dispense Refill  . ACCU-CHEK FASTCLIX LANCETS MISC 1 Units by Does not apply route 4 (four) times daily - after meals and at bedtime.  360 each  1  . cephALEXin (KEFLEX) 500 MG capsule Take 1 capsule (500 mg total) by mouth 2 (two) times daily.  14 capsule  0  . glucose blood (ACCU-CHEK SMARTVIEW) test strip Use as instructed  100 each  12  . hydrOXYzine (VISTARIL) 50 MG capsule Take BID as needed for early contractions  30 capsule  0  . ibuprofen (ADVIL,MOTRIN) 600 MG tablet Take 1 tablet (600 mg total) by mouth every 6 (six) hours.  30 tablet  0  . oxyCODONE-acetaminophen (PERCOCET/ROXICET) 5-325 MG per tablet Take 1-2 tablets by mouth every  4 (four) hours as needed.  40 tablet  0  . Prenatal Vit-Fe Fumarate-FA (PRENATAL MULTIVITAMIN) TABS tablet Take 1 tablet by mouth daily at 12 noon.  30 tablet  3  . ranitidine (ZANTAC) 150 MG tablet Take 150 mg by mouth daily.        Review of Systems  Constitutional: Negative for fever and diaphoresis.  HENT: Negative for congestion, ear pain, nosebleeds and sore throat.   Eyes: Negative for blurred vision, double vision, photophobia and pain.  Respiratory: Negative for cough, shortness of breath and wheezing.   Cardiovascular: Negative for chest pain, palpitations and leg swelling.  Gastrointestinal: Positive for abdominal pain. Negative for heartburn, nausea and vomiting.  Genitourinary: Negative for dysuria, urgency and frequency.  Musculoskeletal: Negative for myalgias.  Skin: Negative for rash.  Neurological: Negative for dizziness, seizures, loss of consciousness, weakness and headaches.   Endo/Heme/Allergies: Does not bruise/bleed easily.  Psychiatric/Behavioral: Negative for depression.   Physical Exam   Blood pressure 161/104, pulse 78, resp. rate 18, currently breastfeeding.  Physical Exam  Constitutional: She is oriented to person, place, and time. She appears well-developed and well-nourished.  HENT:  Head: Normocephalic and atraumatic.  Mouth/Throat: Oropharynx is clear and moist.  Eyes: EOM are normal. Pupils are equal, round, and reactive to light. No scleral icterus.  Neck: Normal range of motion. Neck supple. No thyromegaly present.  Cardiovascular: Normal rate and regular rhythm.   No murmur heard. Respiratory: Effort normal and breath sounds normal. No respiratory distress.  GI: Soft. Bowel sounds are normal. She exhibits no distension. There is tenderness (appropriate suprapubic tenderness near incisional site, no RUQ/epigastric tenderness).  Musculoskeletal: Normal range of motion. She exhibits no edema.  Lymphadenopathy:    She has no cervical adenopathy.  Neurological: She is alert and oriented to person, place, and time. She displays normal reflexes. She exhibits normal muscle tone.  Skin: Skin is warm and dry. No erythema.    MAU Course  Procedures  MDM Serial BPs Protein/Creatinine Ratio CBC with diff CMET Hydralazine X1 10mg   Assessment and Plan  Ms Catherine [name] is a G1P1 with hx of gestational htn who presents pod#6 with severe range pressures and findings of transaminitis on labs  1. Postpartum pre-E- has severe features with diastolics around 110 and CMET(38/70), no RUQ pain,epigastric pain, headaches/n/v. Admit for serial BP checks; expect pr/cr ratio to be elevated given dirty urine (ideally would have been straight cath'd) but 0.13 today; questionable chronic hypertensive? Does not follow reg with PCP enough to trend.  -s/p 10iv hydralazine -daily vasotec 5mg , will not affect lactation -vs q4 -cmet, cbc with diff tmw am -monitor dtrs,  clonus -hold on mag for now given that pt received 24hr treatment post partum for severe pressures -will need to be sent home on BP regimen with recheck in 2-3 weeks  2. Pain control s/p LTCS- pressure dressing to be removed today -ice packs to suprapubic region -avoid NSAIDs given elevated BPs -avoid extra tylenol given elevated LFTs -will continue with percocet for now, may need to alter pending morning cmet  3. Anemia- hgb 8.5 today from 9.0 10/13 and 10.7 on 10/12 -iron supplement BID -will hold on hemolysis labs at this time -plt stable  4. Postpartum care- continues to breast feed and pump -undecided abt contraception   Anselm Lis 12/27/2012, 10:48 AM

## 2012-12-27 NOTE — MAU Provider Note (Addendum)
I spoke with and examined patient and agree with resident's note and plan of care.  Pt POD#6 s/p PLTCS for FTD w/ severe-range bp's and elevated LFTs, asymptomatic. Denies ha, scotomata, ruq/epigastric pain, n/v.  RUQ/epigastrum non-tender, DTRs wnl, no clonus, 2+ BLE edema. Pt w/ appropriate incisional pain/tenderness. Received 1 dose of iv hydralazine in MAU w/o change in BP. Breastfeeding. Being admitted for bp control w/ po vasotec per consult w/ Dr. Shawnie Pons. Pt received magnesium intrapartum d/t severe range pressures and for 24hr pp, so will not repeat at this time.  Cheral Marker, CNM, [facility] 12/27/2012 1:19 PM

## 2012-12-28 DIAGNOSIS — IMO0002 Reserved for concepts with insufficient information to code with codable children: Secondary | ICD-10-CM | POA: Diagnosis present

## 2012-12-28 LAB — COMPREHENSIVE METABOLIC PANEL
AST: 26 U/L (ref 0–37)
Albumin: 2.4 g/dL — ABNORMAL LOW (ref 3.5–5.2)
CO2: 23 mEq/L (ref 19–32)
Calcium: 8.7 mg/dL (ref 8.4–10.5)
Chloride: 103 mEq/L (ref 96–112)
Creatinine, Ser: 0.63 mg/dL (ref 0.50–1.10)
GFR calc non Af Amer: >90 mL/min (ref >90)
Total Protein: 5.3 g/dL — ABNORMAL LOW (ref 6.0–8.3)

## 2012-12-28 LAB — CBC
MCH: 28.2 pg (ref 26.0–34.0)
MCHC: 33.7 g/dL (ref 30.0–36.0)
MCV: 83.6 fL (ref 78.0–100.0)
Platelets: 311 10*3/uL (ref 150–400)
RDW: 14.1 % (ref 11.5–15.5)

## 2012-12-28 MED ORDER — ENALAPRIL MALEATE 10 MG PO TABS
10.0000 mg | ORAL_TABLET | Freq: Every day | ORAL | Status: DC
  Administered 2012-12-28: 10 mg via ORAL
  Filled 2012-12-28 (×2): qty 1

## 2012-12-28 MED ORDER — HYDROCHLOROTHIAZIDE 25 MG PO TABS: 25.0000 mg | ORAL_TABLET | Freq: Every day | ORAL | Status: DC

## 2012-12-28 MED ORDER — HYDROCHLOROTHIAZIDE 25 MG PO TABS
25.0000 mg | ORAL_TABLET | Freq: Every day | ORAL | Status: DC
  Administered 2012-12-28: 25 mg via ORAL
  Filled 2012-12-28 (×2): qty 1

## 2012-12-28 MED ORDER — ENALAPRIL MALEATE 10 MG PO TABS: 10.0000 mg | ORAL_TABLET | Freq: Every day | ORAL | Status: DC

## 2012-12-28 NOTE — Progress Notes (Signed)
Post Partum Day 7, s/p readmit for pp preeclampsia, received Mag sulfate and started on Vasotec(enalapril) 10 daily.   Subjective: no complaints Specifically denies headache , ruq  Objective: Blood pressure 166/89, pulse 75, temperature 98.3 F (36.8 C), temperature source Oral, resp. rate 18, height 5\' 4"  (1.626 m), weight 88.474 kg (195 lb 0.8 oz), SpO2 98.00%, currently breastfeeding.  Physical Exam:  General: alert, cooperative and no distress Lochia: appropriate Uterine Fundus:  Incision: healing well DVT Evaluation: No evidence of DVT seen on physical exam. Reflexes 2+   Recent Labs  12/27/12 1100 12/28/12 0443  HGB 8.5* 9.1*  HCT 25.6* 27.0*    Assessment/Plan: Postpartum preeclampsia, s/p Mag sulfate with persistent htn , stable for outpatient management. Will add HCTZ x 1 wk     LOS: 1 day   Fredrika Canby V 12/28/2012, 7:08 PM

## 2012-12-28 NOTE — Discharge Summary (Signed)
Physician Discharge Summary  Patient ID: Catherine [name] MRN: [id number] DOB/AGE: 35-Jun-1977 35 y.o.  Admit date: 12/27/2012 Discharge date: 12/28/2012  Admission Diagnoses: postpartum Preeclampsia  Discharge Diagnoses:  Active Problems:   Hypertension in pregnancy, preeclampsia, postpartum condition , treated  Discharged Condition: good  Hospital Course: admitted with BP's of 160/100's  To 180/105,hyperreflexia , with normal LFT's , 3+ edema to knees,and received Mag Sulfate x 24 hrs., Pressures remained stable, only slight improvement, with patient feeling subjectively much better, less swollen, and improved reflexes. BP 186/93  Pulse 69  Temp(Src) 99.2 F (37.3 C) (Oral)  Resp 18  Ht 5\' 4"  (1.626 m)  Wt 88.474 kg (195 lb 0.8 oz)  BMI 33.46 kg/m2  SpO2 98%  Breastfeeding? Yes  Prior 2 bp  152-166/84-89   Considered stable for outpatient management   Consults: None  Significant Diagnostic Studies: labs:  CMP     Component Value Date/Time   NA 136 12/28/2012 0443   K 3.8 12/28/2012 0443   CL 103 12/28/2012 0443   CO2 23 12/28/2012 0443   GLUCOSE 91 12/28/2012 0443   BUN 5* 12/28/2012 0443   CREATININE 0.63 12/28/2012 0443   CREATININE 0.59 12/11/2012 1600   CALCIUM 8.7 12/28/2012 0443   PROT 5.3* 12/28/2012 0443   ALBUMIN 2.4* 12/28/2012 0443   AST 26 12/28/2012 0443   ALT 60* 12/28/2012 0443   ALKPHOS 133* 12/28/2012 0443   BILITOT 0.2* 12/28/2012 0443   GFRNONAA >90 12/28/2012 0443   GFRAA >90 12/28/2012 0443  LFT trending downward.    Treatments: magnesium sulfate x 24 hr, BP med initiation and diuretic.  Discharge Exam: Blood pressure 186/93, pulse 69, temperature 99.2 F (37.3 C), temperature source Oral, resp. rate 18, height 5\' 4"  (1.626 m), weight 88.474 kg (195 lb 0.8 oz), SpO2 98.00%, currently breastfeeding. General appearance: alert, cooperative and no distress Eyes: negative, conjunctivae/corneas clear. PERRL, EOM's intact. Fundi  benign. Extremities: edema 3+ PITTING OVER SHIN, IMPROVED PER PT  and Homans sign is negative, no sign of DVT Incision/Wound: INTACT  Disposition: 01-Home or Self Care  Discharge Orders   Future Appointments Provider Department Dept Phone   01/22/2013 1:45 PM Tereso Newcomer, MD [facility] [phone]   Future Orders Complete By Expires   Diet - low sodium heart healthy  As directed    Discharge instructions  As directed    Comments:     Notify clinic of your hospitalization, see them in 5 days for BP check.   Increase activity slowly  As directed        Medication List    STOP taking these medications       hydrOXYzine 50 MG capsule  Commonly known as:  VISTARIL      TAKE these medications       ACCU-CHEK FASTCLIX LANCETS Misc  1 Units by Does not apply route 4 (four) times daily - after meals and at bedtime.     cephALEXin 500 MG capsule  Commonly known as:  KEFLEX  Take 1 capsule (500 mg total) by mouth 2 (two) times daily.     enalapril 10 MG tablet  Commonly known as:  VASOTEC  Take 1 tablet (10 mg total) by mouth daily.     glucose blood test strip  Commonly known as:  ACCU-CHEK SMARTVIEW  Use as instructed     hydrochlorothiazide 25 MG tablet  Commonly known as:  HYDRODIURIL  Take 1 tablet (25 mg total) by mouth daily.  ibuprofen 600 MG tablet  Commonly known as:  ADVIL,MOTRIN  Take 1 tablet (600 mg total) by mouth every 6 (six) hours.     prenatal multivitamin Tabs tablet  Take 1 tablet by mouth daily at 12 noon.     ranitidine 150 MG tablet  Commonly known as:  ZANTAC  Take 150 mg by mouth daily.      ASK your doctor about these medications       oxyCODONE-acetaminophen 5-325 MG per tablet  Commonly known as:  PERCOCET/ROXICET  Take 1-2 tablets by mouth every 4 (four) hours as needed.           Follow-up Information   Schedule an appointment as soon as possible for a visit with [facility]. [phone], call as  needed if symptoms worsen)       SignedTilda Burrow 12/28/2012, 7:19 PM

## 2012-12-28 NOTE — Progress Notes (Signed)
Subjective: Patient reports feeling better.  BP finally down in a better range since additional agents added.   Objective: I have reviewed patient's vital signs, intake and output, medications and labs.  General: alert, cooperative and appears stated age GI: soft, non-tender; bowel sounds normal; no masses,  no organomegaly Extremities: edema 3+ DTR's- 3+, brisk   Assessment/Plan: Pp elevated BP--BP is improved.  Pt. Received magnesium sulfate pp.  Will watch BP's today and if remain down with increasing ambulation, then possible d/c later this afternoon.   LOS: 1 day    Catherine [name] S 12/28/2012, 7:25 AM

## 2013-01-07 ENCOUNTER — Encounter: Payer: Self-pay | Admitting: *Deleted

## 2013-01-08 ENCOUNTER — Ambulatory Visit (INDEPENDENT_AMBULATORY_CARE_PROVIDER_SITE_OTHER): Payer: Medicaid Other | Admitting: General Practice

## 2013-01-08 VITALS — BP 121/79 | HR 76 | Temp 97.5°F | Ht 64.0 in | Wt 174.6 lb

## 2013-01-08 DIAGNOSIS — Z136 Encounter for screening for cardiovascular disorders: Secondary | ICD-10-CM

## 2013-01-08 DIAGNOSIS — Z0130 Encounter for examination of blood pressure without abnormal findings: Secondary | ICD-10-CM

## 2013-01-08 NOTE — Progress Notes (Signed)
Spoke to Dr Debroah Loop about patient's blood pressure, he was fine to have the patient return to [place] in 2 weeks for her post partum visit. Advised patient to continue BP medication and to contact us for any changes in her condition.

## 2013-01-22 ENCOUNTER — Ambulatory Visit (INDEPENDENT_AMBULATORY_CARE_PROVIDER_SITE_OTHER): Payer: Medicaid Other | Admitting: Obstetrics & Gynecology

## 2013-01-22 ENCOUNTER — Encounter: Payer: Self-pay | Admitting: Obstetrics & Gynecology

## 2013-01-22 VITALS — BP 132/81 | HR 75 | Ht 64.0 in | Wt 173.1 lb

## 2013-01-22 DIAGNOSIS — Z309 Encounter for contraceptive management, unspecified: Secondary | ICD-10-CM

## 2013-01-22 MED ORDER — NORGESTIMATE-ETH ESTRADIOL 0.25-35 MG-MCG PO TABS: 1.0000 | ORAL_TABLET | Freq: Every day | ORAL | Status: DC

## 2013-01-22 NOTE — Progress Notes (Signed)
Patient ID: Catherine [name], female   DOB: 10-Aug-1975, 37 y.o.   MRN: [id number] Patient is undecided on birth control method.

## 2013-01-22 NOTE — Patient Instructions (Signed)
Return to clinic for any obstetric concerns or go to MAU for evaluation  

## 2013-01-22 NOTE — Progress Notes (Signed)
  Subjective:     Catherine [name] is a 37 y.o. G89P1001 female who presents for a postpartum visit.  Prenatal course was remarkable for GDM.  She is 4 weeks postpartum following a LTCS for FTP. I have fully reviewed the prenatal and intrapartum course. The delivery was at 39 gestational weeks.. Postpartum course has been complicated by preeclampsia, currently on Enalapril. Baby's course has been uncomplicated. Baby is feeding by breast/bottle. Bleeding: spotting. Bowel function is normal. Bladder function is normal. Patient is not sexually active. Contraception method is OCP (estrogen/progesterone). Postpartum depression screening: negative.  The following portions of the patient's history were reviewed and updated as appropriate: allergies, current medications, past family history, past medical history, past social history, past surgical history and problem list.  Review of Systems Pertinent items are noted in HPI.   Objective:    BP 132/81  Pulse 75  Ht 5\' 4"  (1.626 m)  Wt 173 lb 1.6 oz (78.518 kg)  BMI 29.70 kg/m2  Breastfeeding? Yes  General:  alert and no distress   Breasts:  inspection negative, no nipple discharge or bleeding, no masses or nodularity palpable  Lungs: clear to auscultation bilaterally  Heart:  regular rate and rhythm  Abdomen: soft, non-tender; bowel sounds normal; no masses,  no organomegaly.  Incision C/D/I   Vulva:  normal  Vagina: normal vagina  Cervix:  normal  Corpus: normal size, contour, position, consistency, mobility, non-tender  Adnexa:  normal adnexa and no mass, fullness, tenderness  Rectal Exam: Normal rectovaginal exam        Assessment:   Normal postpartum exam. Pap smear not done at today's visit.   Plan:   1. Contraception: OCP (estrogen/progesterone), prescribed 2. 2 hr GTT scheduled in one month

## 2013-01-23 ENCOUNTER — Encounter: Payer: Self-pay | Admitting: Obstetrics & Gynecology

## 2013-02-19 ENCOUNTER — Other Ambulatory Visit: Payer: Medicaid Other

## 2013-02-19 DIAGNOSIS — O99814 Abnormal glucose complicating childbirth: Secondary | ICD-10-CM

## 2013-02-20 ENCOUNTER — Telehealth: Payer: Self-pay | Admitting: *Deleted

## 2013-02-20 LAB — GLUCOSE TOLERANCE, 2 HOURS: Glucose, 2 hour: 128 mg/dL (ref 70–139)

## 2013-02-20 NOTE — Telephone Encounter (Signed)
Called and left message for patient to call us back for results.

## 2013-02-20 NOTE — Telephone Encounter (Signed)
Message copied by Mannie Stabile on Fri Feb 20, 2013 11:28 AM ------      Message from: Adam Phenix      Created: Fri Feb 20, 2013  8:17 AM       Normal 2 hr GTT ------

## 2013-02-23 NOTE — Telephone Encounter (Signed)
Called patient and informed her of results. Patient verbalized understanding and had no further questions

## 2013-11-27 LAB — FETAL NONSTRESS TEST

## 2014-01-11 ENCOUNTER — Encounter: Payer: Self-pay | Admitting: Obstetrics & Gynecology

## 2015-03-13 DIAGNOSIS — F319 Bipolar disorder, unspecified: Secondary | ICD-10-CM

## 2015-03-13 DIAGNOSIS — F419 Anxiety disorder, unspecified: Secondary | ICD-10-CM

## 2015-03-13 DIAGNOSIS — F32A Depression, unspecified: Secondary | ICD-10-CM

## 2015-03-13 HISTORY — DX: Anxiety disorder, unspecified: F41.9

## 2015-03-13 HISTORY — DX: Depression, unspecified: F32.A

## 2015-03-13 HISTORY — DX: Bipolar disorder, unspecified: F31.9

## 2017-01-01 ENCOUNTER — Encounter (HOSPITAL_COMMUNITY): Payer: Self-pay | Admitting: Emergency Medicine

## 2017-01-01 ENCOUNTER — Ambulatory Visit (HOSPITAL_COMMUNITY)
Admission: EM | Admit: 2017-01-01 | Discharge: 2017-01-01 | Disposition: A | Discharge status: Home / Self Care | Payer: Self-pay | Attending: Emergency Medicine | Admitting: Emergency Medicine

## 2017-01-01 DIAGNOSIS — N3090 Cystitis, unspecified without hematuria: Secondary | ICD-10-CM

## 2017-01-01 DIAGNOSIS — R1030 Lower abdominal pain, unspecified: Secondary | ICD-10-CM

## 2017-01-01 LAB — POCT URINALYSIS DIP (DEVICE)
Bilirubin Urine: NEGATIVE
Glucose, UA: NEGATIVE mg/dL
KETONES UR: 15 mg/dL — AB
Nitrite: POSITIVE — AB
Protein, ur: 100 mg/dL — AB
Specific Gravity, Urine: 1.015 (ref 1.005–1.030)
UROBILINOGEN UA: 0.2 mg/dL (ref 0.0–1.0)
pH: 6 (ref 5.0–8.0)

## 2017-01-01 MED ORDER — SULFAMETHOXAZOLE-TRIMETHOPRIM 800-160 MG PO TABS: 1.0000 | ORAL_TABLET | Freq: Two times a day (BID) | ORAL | 0 refills | Status: DC

## 2017-01-01 MED ORDER — PHENAZOPYRIDINE HCL 200 MG PO TABS: 200.0000 mg | ORAL_TABLET | Freq: Three times a day (TID) | ORAL | 0 refills | Status: DC

## 2017-01-01 NOTE — Discharge Instructions (Signed)
Take medications with food  Avoid drinks with caffeine and sugars Take tylenol and motrin as needed for pain or for fevers  If sx persist you may need to go to the ER for iv abx treatment

## 2017-01-01 NOTE — ED Triage Notes (Signed)
Pt sts UTI sx last week now having chills and cloudy urine

## 2017-01-01 NOTE — ED Provider Notes (Signed)
[facility]    CSN: [id number] Arrival date & time: 01/01/17  1742     History   Chief Complaint Chief Complaint  Patient presents with  . Urinary Tract Infection    HPI Catherine [name] is a 41 y.o. female.   Pt is here for lower flank pain, frequency, chills, and lower abd pain for 2 weeks. States that she thought it was a uti and was treating it at home, but it is not getting any better. Was taking abx for a tooth procedure and thought this would help. Denies any n/v/d       Past Medical History:  Diagnosis Date  . Advanced maternal age (AMA) in pregnancy   . GERD (gastroesophageal reflux disease) 11/03/2012  . Gestational diabetes    diet controlled  . Gestational hypertension   . Late prenatal care   . Medical history non-contributory     There are no active problems to display for this patient.   Past Surgical History:  Procedure Laterality Date  . CESAREAN SECTION N/A 12/21/2012   Procedure: CESAREAN SECTION;  Surgeon: Florian Buff, MD;  Location: [facility];  Service: Obstetrics;  Laterality: N/A;  . LAPAROSCOPIC OVARIAN  2004    OB History    Gravida Para Term Preterm AB Living   1 1 1  0 0 1   SAB TAB Ectopic Multiple Live Births   0 0 0 0 1       Home Medications    Prior to Admission medications   Medication Sig Start Date End Date Taking? Authorizing Provider  ACCU-CHEK FASTCLIX LANCETS MISC 1 Units by Does not apply route 4 (four) times daily - after meals and at bedtime. 10/27/12   Allen Norris, MD  cephALEXin (KEFLEX) 500 MG capsule Take 1 capsule (500 mg total) by mouth 2 (two) times daily. 12/16/12   Gwen Pounds, CNM  enalapril (VASOTEC) 10 MG tablet Take 1 tablet (10 mg total) by mouth daily. 12/28/12   Jonnie Kind, MD  glucose blood (ACCU-CHEK SMARTVIEW) test strip Use as instructed 10/27/12   Allen Norris, MD  hydrochlorothiazide (HYDRODIURIL) 25 MG tablet Take 1 tablet (25 mg total) by mouth daily. 12/28/12    Jonnie Kind, MD  ibuprofen (ADVIL,MOTRIN) 600 MG tablet Take 1 tablet (600 mg total) by mouth every 6 (six) hours. 12/24/12   Seabron Spates, CNM  norgestimate-ethinyl estradiol (ORTHO-CYCLEN,SPRINTEC,PREVIFEM) 0.25-35 MG-MCG tablet Take 1 tablet by mouth daily. 01/22/13   Anyanwu, Sallyanne Havers, MD  oxyCODONE-acetaminophen (PERCOCET/ROXICET) 5-325 MG per tablet Take 1-2 tablets by mouth every 4 (four) hours as needed. 12/24/12   Seabron Spates, CNM  phenazopyridine (PYRIDIUM) 200 MG tablet Take 1 tablet (200 mg total) by mouth 3 (three) times daily. 01/01/17   Marney Setting, NP  Prenatal Vit-Fe Fumarate-FA (PRENATAL MULTIVITAMIN) TABS tablet Take 1 tablet by mouth daily at 12 noon. 11/20/12   Anyanwu, Sallyanne Havers, MD  ranitidine (ZANTAC) 150 MG tablet Take 150 mg by mouth daily.    [provider]  sulfamethoxazole-trimethoprim (BACTRIM DS,SEPTRA DS) 800-160 MG tablet Take 1 tablet by mouth 2 (two) times daily. 01/01/17   Marney Setting, NP    Family History Family History  Problem Relation Age of Onset  . Alcohol abuse Neg Hx   . Arthritis Neg Hx   . Asthma Neg Hx   . Birth defects Neg Hx   . Cancer Neg Hx   . COPD Neg Hx   .  Depression Neg Hx   . Diabetes Neg Hx   . Drug abuse Neg Hx   . Early death Neg Hx   . Hearing loss Neg Hx   . Heart disease Neg Hx   . Hyperlipidemia Neg Hx   . Hypertension Neg Hx   . Kidney disease Neg Hx   . Learning disabilities Neg Hx   . Mental illness Neg Hx   . Mental retardation Neg Hx   . Miscarriages / Stillbirths Neg Hx   . Stroke Neg Hx   . Vision loss Neg Hx     Social History Social History  Substance Use Topics  . Smoking status: Never Smoker  . Smokeless tobacco: Never Used  . Alcohol use No     Allergies   Patient has no known allergies.   Review of Systems Review of Systems  Constitutional: Positive for chills.  Respiratory: Negative.   Cardiovascular: Negative.   Gastrointestinal: Positive for  abdominal pain.  Genitourinary: Positive for dysuria, flank pain, frequency, pelvic pain and urgency.  Skin: Negative.   Neurological: Negative.      Physical Exam Triage Vital Signs ED Triage Vitals [01/01/17 1822]  Enc Vitals Group     BP (!) 149/80     Pulse Rate (!) 107     Resp 18     Temp 100.1 F (37.8 C)     Temp Source Oral     SpO2 95 %     Weight      Height      Head Circumference      Peak Flow      Pain Score 5     Pain Loc      Pain Edu?      Excl. in [place]?    No data found.   Updated Vital Signs BP (!) 149/80 (BP Location: Right Arm)   Pulse (!) 107   Temp 100.1 F (37.8 C) (Oral)   Resp 18   SpO2 95%   Visual Acuity Right Eye Distance:   Left Eye Distance:   Bilateral Distance:    Right Eye Near:   Left Eye Near:    Bilateral Near:     Physical Exam  Constitutional: She appears well-developed.  Cardiovascular: Normal rate and regular rhythm.   Pulmonary/Chest: Effort normal and breath sounds normal.  Abdominal: Soft. Bowel sounds are normal. There is tenderness.  Lower abd and rt flank   Neurological: She is alert.  Skin: Skin is warm.     UC Treatments / Results  Labs (all labs ordered are listed, but only abnormal results are displayed) Labs Reviewed  POCT URINALYSIS DIP (DEVICE) - Abnormal; Notable for the following:       Result Value   Ketones, ur 15 (*)    Hgb urine dipstick MODERATE (*)    Protein, ur 100 (*)    Nitrite POSITIVE (*)    Leukocytes, UA MODERATE (*)    All other components within normal limits    EKG  EKG Interpretation None       Radiology No results found.  Procedures Procedures (including critical care time)  Medications Ordered in UC Medications - No data to display   Initial Impression / Assessment and Plan / UC Course  I have reviewed the triage vital signs and the nursing notes.  Pertinent labs & imaging results that were available during my care of the patient were reviewed by me  and considered in my medical decision making (see  chart for details).     Take medications with food  Avoid drinks with caffeine and sugars Take tylenol and motrin as needed for pain or for fevers  If sx persist you may need to go to the ER for iv abx treatment   Final Clinical Impressions(s) / UC Diagnoses   Final diagnoses:  Cystitis  Lower abdominal pain    New Prescriptions New Prescriptions   PHENAZOPYRIDINE (PYRIDIUM) 200 MG TABLET    Take 1 tablet (200 mg total) by mouth 3 (three) times daily.   SULFAMETHOXAZOLE-TRIMETHOPRIM (BACTRIM DS,SEPTRA DS) 800-160 MG TABLET    Take 1 tablet by mouth 2 (two) times daily.     Controlled Substance Prescriptions [place] Controlled Substance Registry consulted? Not Applicable   Marney Setting, NP 01/01/17 1911

## 2017-03-18 ENCOUNTER — Other Ambulatory Visit: Payer: Self-pay

## 2017-03-18 ENCOUNTER — Emergency Department (HOSPITAL_COMMUNITY)
Admission: EM | Admit: 2017-03-18 | Discharge: 2017-03-18 | Disposition: A | Discharge status: Other Acute Inpatient Hospital | Payer: Self-pay | Attending: Emergency Medicine | Admitting: Emergency Medicine

## 2017-03-18 ENCOUNTER — Encounter (HOSPITAL_COMMUNITY): Payer: Self-pay | Admitting: Emergency Medicine

## 2017-03-18 DIAGNOSIS — Z79899 Other long term (current) drug therapy: Secondary | ICD-10-CM | POA: Insufficient documentation

## 2017-03-18 DIAGNOSIS — R21 Rash and other nonspecific skin eruption: Secondary | ICD-10-CM | POA: Insufficient documentation

## 2017-03-18 DIAGNOSIS — R45851 Suicidal ideations: Secondary | ICD-10-CM | POA: Insufficient documentation

## 2017-03-18 DIAGNOSIS — F419 Anxiety disorder, unspecified: Secondary | ICD-10-CM | POA: Insufficient documentation

## 2017-03-18 DIAGNOSIS — F332 Major depressive disorder, recurrent severe without psychotic features: Secondary | ICD-10-CM

## 2017-03-18 DIAGNOSIS — G4700 Insomnia, unspecified: Secondary | ICD-10-CM

## 2017-03-18 DIAGNOSIS — F331 Major depressive disorder, recurrent, moderate: Secondary | ICD-10-CM | POA: Insufficient documentation

## 2017-03-18 DIAGNOSIS — F4310 Post-traumatic stress disorder, unspecified: Secondary | ICD-10-CM

## 2017-03-18 DIAGNOSIS — Z046 Encounter for general psychiatric examination, requested by authority: Secondary | ICD-10-CM | POA: Insufficient documentation

## 2017-03-18 DIAGNOSIS — R450 Nervousness: Secondary | ICD-10-CM

## 2017-03-18 HISTORY — DX: Unspecified pre-eclampsia, unspecified trimester: O14.90

## 2017-03-18 LAB — RAPID URINE DRUG SCREEN, HOSP PERFORMED
Amphetamines: NOT DETECTED
Barbiturates: NOT DETECTED
Benzodiazepines: NOT DETECTED
Cocaine: POSITIVE — AB
OPIATES: NOT DETECTED
Tetrahydrocannabinol: NOT DETECTED

## 2017-03-18 LAB — COMPREHENSIVE METABOLIC PANEL
ALT: 33 U/L (ref 14–54)
ANION GAP: 9 (ref 5–15)
AST: 20 U/L (ref 15–41)
Albumin: 4.1 g/dL (ref 3.5–5.0)
Alkaline Phosphatase: 56 U/L (ref 38–126)
BUN: 11 mg/dL (ref 6–20)
CHLORIDE: 105 mmol/L (ref 101–111)
CO2: 22 mmol/L (ref 22–32)
Calcium: 9 mg/dL (ref 8.9–10.3)
Creatinine, Ser: 0.71 mg/dL (ref 0.44–1.00)
GFR calc non Af Amer: >60 mL/min (ref >60)
Glucose, Bld: 94 mg/dL (ref 65–99)
Potassium: 3.8 mmol/L (ref 3.5–5.1)
SODIUM: 136 mmol/L (ref 135–145)
Total Bilirubin: 0.8 mg/dL (ref 0.3–1.2)
Total Protein: 7.3 g/dL (ref 6.5–8.1)

## 2017-03-18 LAB — ACETAMINOPHEN LEVEL

## 2017-03-18 LAB — CBC
HCT: 40 % (ref 36.0–46.0)
Hemoglobin: 13.1 g/dL (ref 12.0–15.0)
MCH: 29 pg (ref 26.0–34.0)
MCHC: 32.8 g/dL (ref 30.0–36.0)
MCV: 88.7 fL (ref 78.0–100.0)
PLATELETS: 239 10*3/uL (ref 150–400)
RBC: 4.51 MIL/uL (ref 3.87–5.11)
RDW: 12.7 % (ref 11.5–15.5)
WBC: 7.3 10*3/uL (ref 4.0–10.5)

## 2017-03-18 LAB — ETHANOL

## 2017-03-18 LAB — SALICYLATE LEVEL: Salicylate Lvl: <7 mg/dL (ref 2.8–30.0)

## 2017-03-18 LAB — I-STAT BETA HCG BLOOD, ED (MC, WL, AP ONLY): I-stat hCG, quantitative: <5 m[IU]/mL (ref <5)

## 2017-03-18 MED ORDER — ALUM & MAG HYDROXIDE-SIMETH 200-200-20 MG/5ML PO SUSP: 30.0000 mL | Freq: Four times a day (QID) | ORAL | Status: DC | PRN

## 2017-03-18 MED ORDER — KETOCONAZOLE 2 % EX CREA
TOPICAL_CREAM | Freq: Two times a day (BID) | CUTANEOUS | Status: DC
  Administered 2017-03-18: 15:00:00 via TOPICAL
  Filled 2017-03-18: qty 15

## 2017-03-18 MED ORDER — HYDROCHLOROTHIAZIDE 12.5 MG PO CAPS
12.5000 mg | ORAL_CAPSULE | Freq: Every day | ORAL | Status: DC
  Administered 2017-03-18: 12.5 mg via ORAL
  Filled 2017-03-18: qty 1

## 2017-03-18 MED ORDER — ONDANSETRON HCL 4 MG PO TABS: 4.0000 mg | ORAL_TABLET | Freq: Three times a day (TID) | ORAL | Status: DC | PRN

## 2017-03-18 MED ORDER — ACETAMINOPHEN 325 MG PO TABS: 650.0000 mg | ORAL_TABLET | ORAL | Status: DC | PRN

## 2017-03-18 MED ORDER — ZOLPIDEM TARTRATE 5 MG PO TABS
5.0000 mg | ORAL_TABLET | Freq: Every evening | ORAL | Status: DC | PRN
  Administered 2017-03-18: 5 mg via ORAL
  Filled 2017-03-18: qty 1
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
## 2017-03-18 NOTE — ED Triage Notes (Signed)
BP med ordered by DR Darl Householder. Pt reported he has not been to a doctor since 2014 when she had high BP during pregnancy.

## 2017-03-18 NOTE — ED Triage Notes (Signed)
TTS done 

## 2017-03-18 NOTE — ED Notes (Signed)
Patient was given a Media planner and Drink. A Regular Diet was ordered for Lunch.

## 2017-03-18 NOTE — Progress Notes (Signed)
Patient meets criteria for inpatient treatment. CSW faxed referrals to the following inpatient facilities for review:  [facility], [facility], [facility], [facility], [facility], [facility]    TTS will continue to seek bed placement.   Radonna Ricker, MSW, [place] Clinical Social Worker (Disposition) [facility]  [phone]

## 2017-03-18 NOTE — ED Provider Notes (Signed)
Blood pressure (!) 152/86, pulse 84, temperature 97.6 F (36.4 C), temperature source Oral, resp. rate 18, height 5\' 4"  (1.626 m), weight 63.5 kg (140 lb), SpO2 98 %, currently breastfeeding.  In short, Catherine [name] is a 42 y.o. female with a chief complaint of Suicidal .  Refer to the original H&P for additional details.  Was asked to evaluate the patient regarding an itchy rash on the right lower leg weeks.  No evidence of cellulitis, abscess, or deep space leg infection.  Symptoms are most consistent with tinea corporis.  Plan for topical antifungal and primary care physician follow-up once acute psychiatry issues are addressed.   Nanda Quinton, MD    Margette Fast, MD 03/18/17 1440

## 2017-03-18 NOTE — ED Provider Notes (Signed)
[facility] EMERGENCY DEPARTMENT Provider Note   CSN: [id number] Arrival date & time: 03/18/17  0043     History   Chief Complaint Chief Complaint  Patient presents with  . Suicidal    HPI Catherine [name] is a 42 y.o. female.  HPI 42 yo female presenting to the ER with increasing anxiety and depression. Found tonight [place] overpass. She called her family and reportedly requested for help. Stating she was going to end her life. Pt now reports she wasn't going to jump or kill herself, that she was just trying to get some peace and quiet. Hx of prior law enforcement. Hx of PTSD. On no meds. No prior psych evaluation.    Past Medical History:  Diagnosis Date  . Advanced maternal age (AMA) in pregnancy   . GERD (gastroesophageal reflux disease) 11/03/2012  . Gestational diabetes    diet controlled  . Gestational hypertension   . Late prenatal care   . Medical history non-contributory     There are no active problems to display for this patient.   Past Surgical History:  Procedure Laterality Date  . CESAREAN SECTION N/A 12/21/2012   Procedure: CESAREAN SECTION;  Surgeon: Florian Buff, MD;  Location: [facility];  Service: Obstetrics;  Laterality: N/A;  . LAPAROSCOPIC OVARIAN  2004    OB History    Gravida Para Term Preterm AB Living   1 1 1  0 0 1   SAB TAB Ectopic Multiple Live Births   0 0 0 0 1       Home Medications    Prior to Admission medications   Medication Sig Start Date End Date Taking? Authorizing Provider  ACCU-CHEK FASTCLIX LANCETS MISC 1 Units by Does not apply route 4 (four) times daily - after meals and at bedtime. 10/27/12   Allen Norris, MD  cephALEXin (KEFLEX) 500 MG capsule Take 1 capsule (500 mg total) by mouth 2 (two) times daily. 12/16/12   Gwen Pounds, CNM  enalapril (VASOTEC) 10 MG tablet Take 1 tablet (10 mg total) by mouth daily. 12/28/12   Jonnie Kind, MD  glucose blood (ACCU-CHEK SMARTVIEW) test strip Use as  instructed 10/27/12   Allen Norris, MD  hydrochlorothiazide (HYDRODIURIL) 25 MG tablet Take 1 tablet (25 mg total) by mouth daily. 12/28/12   Jonnie Kind, MD  ibuprofen (ADVIL,MOTRIN) 600 MG tablet Take 1 tablet (600 mg total) by mouth every 6 (six) hours. 12/24/12   Seabron Spates, CNM  norgestimate-ethinyl estradiol (ORTHO-CYCLEN,SPRINTEC,PREVIFEM) 0.25-35 MG-MCG tablet Take 1 tablet by mouth daily. 01/22/13   Anyanwu, Sallyanne Havers, MD  oxyCODONE-acetaminophen (PERCOCET/ROXICET) 5-325 MG per tablet Take 1-2 tablets by mouth every 4 (four) hours as needed. 12/24/12   Seabron Spates, CNM  phenazopyridine (PYRIDIUM) 200 MG tablet Take 1 tablet (200 mg total) by mouth 3 (three) times daily. 01/01/17   Marney Setting, NP  Prenatal Vit-Fe Fumarate-FA (PRENATAL MULTIVITAMIN) TABS tablet Take 1 tablet by mouth daily at 12 noon. 11/20/12   Anyanwu, Sallyanne Havers, MD  ranitidine (ZANTAC) 150 MG tablet Take 150 mg by mouth daily.    [provider]  sulfamethoxazole-trimethoprim (BACTRIM DS,SEPTRA DS) 800-160 MG tablet Take 1 tablet by mouth 2 (two) times daily. 01/01/17   Marney Setting, NP    Family History Family History  Problem Relation Age of Onset  . Alcohol abuse Neg Hx   . Arthritis Neg Hx   . Asthma Neg Hx   .  Birth defects Neg Hx   . Cancer Neg Hx   . COPD Neg Hx   . Depression Neg Hx   . Diabetes Neg Hx   . Drug abuse Neg Hx   . Early death Neg Hx   . Hearing loss Neg Hx   . Heart disease Neg Hx   . Hyperlipidemia Neg Hx   . Hypertension Neg Hx   . Kidney disease Neg Hx   . Learning disabilities Neg Hx   . Mental illness Neg Hx   . Mental retardation Neg Hx   . Miscarriages / Stillbirths Neg Hx   . Stroke Neg Hx   . Vision loss Neg Hx     Social History Social History   Tobacco Use  . Smoking status: Never Smoker  . Smokeless tobacco: Never Used  Substance Use Topics  . Alcohol use: No  . Drug use: No     Allergies   Patient has no known  allergies.   Review of Systems Review of Systems  All other systems reviewed and are negative.    Physical Exam Updated Vital Signs BP (!) 184/115 (BP Location: Right Arm)   Pulse 84   Temp 98.4 F (36.9 C) (Oral)   Resp 18   Ht 5\' 4"  (1.626 m)   Wt 63.5 kg (140 lb)   SpO2 100%   BMI 24.03 kg/m   Physical Exam  Constitutional: She is oriented to person, place, and time. She appears well-developed and well-nourished.  HENT:  Head: Normocephalic.  Eyes: EOM are normal.  Neck: Normal range of motion.  Cardiovascular: Normal rate.  Pulmonary/Chest: Effort normal.  Abdominal: She exhibits no distension.  Musculoskeletal: Normal range of motion.  Neurological: She is alert and oriented to person, place, and time.  Psychiatric: She has a normal mood and affect. Her speech is normal and behavior is normal. Judgment normal. She is not actively hallucinating.  Nursing note and vitals reviewed.    ED Treatments / Results  Labs (all labs ordered are listed, but only abnormal results are displayed) Labs Reviewed  ACETAMINOPHEN LEVEL - Abnormal; Notable for the following components:      Result Value   Acetaminophen (Tylenol), Serum <10 (*)    All other components within normal limits  RAPID URINE DRUG SCREEN, HOSP PERFORMED - Abnormal; Notable for the following components:   Cocaine POSITIVE (*)    All other components within normal limits  COMPREHENSIVE METABOLIC PANEL  ETHANOL  SALICYLATE LEVEL  CBC  I-STAT BETA HCG BLOOD, ED ([facility], [facility], [facility] ONLY)    EKG  EKG Interpretation None       Radiology No results found.  Procedures Procedures (including critical care time)  Medications Ordered in ED Medications  ondansetron (ZOFRAN) tablet 4 mg (not administered)  zolpidem (AMBIEN) tablet 5 mg (not administered)  alum & mag hydroxide-simeth (MAALOX/MYLANTA) 200-200-20 MG/5ML suspension 30 mL (not administered)  acetaminophen (TYLENOL) tablet 650 mg (not  administered)     Initial Impression / Assessment and Plan / ED Course  I have reviewed the triage vital signs and the nursing notes.  Pertinent labs & imaging results that were available during my care of the patient were reviewed by me and considered in my medical decision making (see chart for details).     Medically clear. TTS to evaluate for possible placement  Final Clinical Impressions(s) / ED Diagnoses   Final diagnoses:  None    ED Discharge Orders    None  Jola Schmidt, MD 03/18/17 [id number]

## 2017-03-18 NOTE — ED Notes (Signed)
Called for sitter ?
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
## 2017-03-18 NOTE — ED Notes (Signed)
Communications center has been called for transport

## 2017-03-18 NOTE — ED Notes (Signed)
Pt's family has belongings

## 2017-03-18 NOTE — Progress Notes (Signed)
CSW faxed IVC to [facility].  Areatha Keas. Judi Cong, MSW, [place] Disposition Clinical Social Work [phone] (cell) [phone] (office)

## 2017-03-18 NOTE — ED Triage Notes (Signed)
DR. Darl Householder filled out IVC papers.

## 2017-03-18 NOTE — ED Notes (Signed)
Breakfast Tray Ordered. 

## 2017-03-18 NOTE — Progress Notes (Addendum)
Disposition CSW received accepting information for patient from [facility], contingent on the possibility of pt being IVC'd.  CSW contacted [facility] ED Psych nurse, Elizbeth Squires, RN and requested that she ask EDP if they would review pt chart and consider initiating an IVC.  She agreed to do so and pt was IVC'd by Dr. Darl Householder. CSW will fax completed IVC to [facility]  Once IVC is served, pt is accepted at [facility].per Yvetta Coder in Intake and Assessment Dr. Vanice Sarah is the accepting provider Nurse to Nurse report # [phone] Pt  May be transported by law enforcement to arrive before 9PM tonight or, if that is not possible, pt may be transported to arrive at 8 AM tomorrow.  [facility] ED Nurse, Luellen Pucker notified.  Areatha Keas. Judi Cong, MSW, [place] Disposition Clinical Social Work [phone] (cell) [phone] (office)

## 2017-03-18 NOTE — ED Notes (Signed)
Regular Diet was ordered for Dinner. 

## 2017-03-18 NOTE — ED Triage Notes (Signed)
Pt called her father from an overpass as she planned on ending her life. He brought her to the ED.  She is experiencing visual/auditory hallucinations.  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  She is expression paranoid thought.

## 2017-10-13 ENCOUNTER — Encounter (HOSPITAL_COMMUNITY): Payer: Self-pay | Admitting: Emergency Medicine

## 2017-10-13 ENCOUNTER — Emergency Department (HOSPITAL_COMMUNITY)
Admission: EM | Admit: 2017-10-13 | Discharge: 2017-10-14 | Disposition: A | Discharge status: Home / Self Care | Payer: Self-pay | Attending: Emergency Medicine | Admitting: Emergency Medicine

## 2017-10-13 DIAGNOSIS — R109 Unspecified abdominal pain: Secondary | ICD-10-CM

## 2017-10-13 DIAGNOSIS — R1031 Right lower quadrant pain: Secondary | ICD-10-CM

## 2017-10-13 DIAGNOSIS — K529 Noninfective gastroenteritis and colitis, unspecified: Secondary | ICD-10-CM | POA: Insufficient documentation

## 2017-10-13 DIAGNOSIS — E876 Hypokalemia: Secondary | ICD-10-CM | POA: Insufficient documentation

## 2017-10-13 LAB — COMPREHENSIVE METABOLIC PANEL
ALT: 20 U/L (ref 0–44)
ANION GAP: 14 (ref 5–15)
AST: 19 U/L (ref 15–41)
Albumin: 4.2 g/dL (ref 3.5–5.0)
Alkaline Phosphatase: 74 U/L (ref 38–126)
BUN: 17 mg/dL (ref 6–20)
CHLORIDE: 95 mmol/L — AB (ref 98–111)
CO2: 22 mmol/L (ref 22–32)
Calcium: 9.2 mg/dL (ref 8.9–10.3)
Creatinine, Ser: 1.21 mg/dL — ABNORMAL HIGH (ref 0.44–1.00)
GFR calc Af Amer: >60 mL/min (ref >60)
GFR calc non Af Amer: 56 mL/min — ABNORMAL LOW (ref >60)
Glucose, Bld: 142 mg/dL — ABNORMAL HIGH (ref 70–99)
POTASSIUM: 2.6 mmol/L — AB (ref 3.5–5.1)
Sodium: 131 mmol/L — ABNORMAL LOW (ref 135–145)
Total Bilirubin: 0.8 mg/dL (ref 0.3–1.2)
Total Protein: 9.2 g/dL — ABNORMAL HIGH (ref 6.5–8.1)

## 2017-10-13 LAB — CBC
HCT: 49.2 % — ABNORMAL HIGH (ref 36.0–46.0)
Hemoglobin: 16 g/dL — ABNORMAL HIGH (ref 12.0–15.0)
MCH: 27.9 pg (ref 26.0–34.0)
MCHC: 32.5 g/dL (ref 30.0–36.0)
MCV: 85.7 fL (ref 78.0–100.0)
Platelets: 357 10*3/uL (ref 150–400)
RBC: 5.74 MIL/uL — ABNORMAL HIGH (ref 3.87–5.11)
RDW: 13.2 % (ref 11.5–15.5)
WBC: 11.7 10*3/uL — ABNORMAL HIGH (ref 4.0–10.5)

## 2017-10-13 LAB — I-STAT BETA HCG BLOOD, ED (MC, WL, AP ONLY): I-stat hCG, quantitative: <5 m[IU]/mL (ref <5)

## 2017-10-13 LAB — LIPASE, BLOOD: Lipase: 29 U/L (ref 11–51)

## 2017-10-13 MED ORDER — SODIUM CHLORIDE 0.9 % IV BOLUS
1000.0000 mL | Freq: Once | INTRAVENOUS | Status: AC
  Administered 2017-10-13: 1000 mL via INTRAVENOUS

## 2017-10-13 MED ORDER — MORPHINE SULFATE (PF) 4 MG/ML IV SOLN
4.0000 mg | Freq: Once | INTRAVENOUS | Status: AC
  Administered 2017-10-13: 4 mg via INTRAVENOUS
  Filled 2017-10-13: qty 1

## 2017-10-13 MED ORDER — POTASSIUM CHLORIDE 10 MEQ/100ML IV SOLN
10.0000 meq | Freq: Once | INTRAVENOUS | Status: AC
  Administered 2017-10-13: 10 meq via INTRAVENOUS
  Filled 2017-10-13: qty 100

## 2017-10-13 MED ORDER — ONDANSETRON HCL 4 MG/2ML IJ SOLN
4.0000 mg | Freq: Once | INTRAMUSCULAR | Status: AC
  Administered 2017-10-13: 4 mg via INTRAVENOUS
  Filled 2017-10-13: qty 2

## 2017-10-13 NOTE — ED Provider Notes (Signed)
[facility] EMERGENCY DEPARTMENT Provider Note   CSN: [id number] Arrival date & time: 10/13/17  2132     History   Chief Complaint Chief Complaint  Patient presents with  . Abdominal Pain    HPI Catherine [name] is a 42 y.o. female.  Patient is a 42 year old female with history of GERD presenting for evaluation of right-sided abdominal pain.  This has been ongoing for the past 2 days and is worsening.  It is associated with nausea, vomiting, and loss of appetite.  She does report some loose stool but denies any urinary complaints.  She has felt chilled at home, however has not taken her temperature.  Last period was 2 weeks ago and normal.  The history is provided by the patient.  Abdominal Pain   This is a new problem. The current episode started 2 days ago. The problem occurs constantly. The problem has been gradually worsening. The pain is associated with an unknown factor. The pain is located in the RLQ. The quality of the pain is cramping. The pain is moderate. Associated symptoms include nausea. Pertinent negatives include fever, vomiting, dysuria, frequency and hematuria. The symptoms are aggravated by certain positions. Nothing relieves the symptoms.    Past Medical History:  Diagnosis Date  . Advanced maternal age (AMA) in pregnancy   . GERD (gastroesophageal reflux disease) 11/03/2012  . Gestational diabetes    diet controlled  . Gestational hypertension   . Late prenatal care   . Medical history non-contributory   . Preeclampsia     Patient Active Problem List   Diagnosis Date Noted  . PTSD (post-traumatic stress disorder) 03/18/2017    Past Surgical History:  Procedure Laterality Date  . CESAREAN SECTION N/A 12/21/2012   Procedure: CESAREAN SECTION;  Surgeon: Florian Buff, MD;  Location: [facility];  Service: Obstetrics;  Laterality: N/A;  . LAPAROSCOPIC OVARIAN  2004     OB History    Gravida  1   Para  1   Term  1   Preterm  0   AB   0   Living  1     SAB  0   TAB  0   Ectopic  0   Multiple  0   Live Births  1            Home Medications    Prior to Admission medications   Medication Sig Start Date End Date Taking? Authorizing Provider  acetaminophen (TYLENOL) 325 MG tablet Take 325-650 mg by mouth every 6 (six) hours as needed for mild pain or headache.    [provider]  cephALEXin (KEFLEX) 500 MG capsule Take 1 capsule (500 mg total) by mouth 2 (two) times daily. Patient not taking: Reported on 03/18/2017 12/16/12   Gwen Pounds, CNM  enalapril (VASOTEC) 10 MG tablet Take 1 tablet (10 mg total) by mouth daily. Patient not taking: Reported on 03/18/2017 12/28/12   Jonnie Kind, MD  glucose blood (ACCU-CHEK SMARTVIEW) test strip Use as instructed Patient not taking: Reported on 03/18/2017 10/27/12   Allen Norris, MD  hydrochlorothiazide (HYDRODIURIL) 25 MG tablet Take 1 tablet (25 mg total) by mouth daily. Patient not taking: Reported on 03/18/2017 12/28/12   Jonnie Kind, MD  ibuprofen (ADVIL,MOTRIN) 600 MG tablet Take 1 tablet (600 mg total) by mouth every 6 (six) hours. Patient not taking: Reported on 03/18/2017 12/24/12   Seabron Spates, CNM  norgestimate-ethinyl estradiol (ORTHO-CYCLEN,SPRINTEC,PREVIFEM) 0.25-35 MG-MCG tablet Take  1 tablet by mouth daily. Patient not taking: Reported on 03/18/2017 01/22/13   Osborne Oman, MD  oxyCODONE-acetaminophen (PERCOCET/ROXICET) 5-325 MG per tablet Take 1-2 tablets by mouth every 4 (four) hours as needed. Patient not taking: Reported on 03/18/2017 12/24/12   Seabron Spates, CNM  phenazopyridine (PYRIDIUM) 200 MG tablet Take 1 tablet (200 mg total) by mouth 3 (three) times daily. Patient not taking: Reported on 03/18/2017 01/01/17   Marney Setting, NP  Prenatal Vit-Fe Fumarate-FA (PRENATAL MULTIVITAMIN) TABS tablet Take 1 tablet by mouth daily at 12 noon. Patient not taking: Reported on 03/18/2017 11/20/12   Osborne Oman, MD    sulfamethoxazole-trimethoprim (BACTRIM DS,SEPTRA DS) 800-160 MG tablet Take 1 tablet by mouth 2 (two) times daily. Patient not taking: Reported on 03/18/2017 01/01/17   Marney Setting, NP    Family History Family History  Problem Relation Age of Onset  . Alcohol abuse Neg Hx   . Arthritis Neg Hx   . Asthma Neg Hx   . Birth defects Neg Hx   . Cancer Neg Hx   . COPD Neg Hx   . Depression Neg Hx   . Diabetes Neg Hx   . Drug abuse Neg Hx   . Early death Neg Hx   . Hearing loss Neg Hx   . Heart disease Neg Hx   . Hyperlipidemia Neg Hx   . Hypertension Neg Hx   . Kidney disease Neg Hx   . Learning disabilities Neg Hx   . Mental illness Neg Hx   . Mental retardation Neg Hx   . Miscarriages / Stillbirths Neg Hx   . Stroke Neg Hx   . Vision loss Neg Hx     Social History Social History   Tobacco Use  . Smoking status: Never Smoker  . Smokeless tobacco: Never Used  Substance Use Topics  . Alcohol use: No  . Drug use: No     Allergies   Patient has no known allergies.   Review of Systems Review of Systems  Constitutional: Negative for fever.  Gastrointestinal: Positive for abdominal pain and nausea. Negative for vomiting.  Genitourinary: Negative for dysuria, frequency and hematuria.  All other systems reviewed and are negative.    Physical Exam Updated Vital Signs BP (!) 150/108 (BP Location: Left Arm)   Pulse 95   Temp 98.6 F (37 C) (Oral)   Resp 19   Ht 5\' 3"  (1.6 m)   Wt 68 kg (150 lb)   SpO2 100%   BMI 26.57 kg/m   Physical Exam  Constitutional: She is oriented to person, place, and time. She appears well-developed and well-nourished. No distress.  HENT:  Head: Normocephalic and atraumatic.  Neck: Normal range of motion. Neck supple.  Cardiovascular: Normal rate and regular rhythm. Exam reveals no gallop and no friction rub.  No murmur heard. Pulmonary/Chest: Effort normal and breath sounds normal. No respiratory distress. She has no wheezes.   Abdominal: Soft. Bowel sounds are normal. She exhibits no distension. There is tenderness in the right lower quadrant. There is no rigidity, no rebound and no guarding.  Musculoskeletal: Normal range of motion.  Neurological: She is alert and oriented to person, place, and time.  Skin: Skin is warm and dry. She is not diaphoretic.  Nursing note and vitals reviewed.    ED Treatments / Results  Labs (all labs ordered are listed, but only abnormal results are displayed) Labs Reviewed  COMPREHENSIVE METABOLIC PANEL - Abnormal; Notable for  the following components:      Result Value   Sodium 131 (*)    Potassium 2.6 (*)    Chloride 95 (*)    Glucose, Bld 142 (*)    Creatinine, Ser 1.21 (*)    Total Protein 9.2 (*)    GFR calc non Af Amer 56 (*)    All other components within normal limits  CBC - Abnormal; Notable for the following components:   WBC 11.7 (*)    RBC 5.74 (*)    Hemoglobin 16.0 (*)    HCT 49.2 (*)    All other components within normal limits  LIPASE, BLOOD  URINALYSIS, ROUTINE W REFLEX MICROSCOPIC  I-STAT BETA HCG BLOOD, ED ([facility], [facility], [facility] ONLY)    EKG None  Radiology No results found.  Procedures Procedures (including critical care time)  Medications Ordered in ED Medications  sodium chloride 0.9 % bolus 1,000 mL (has no administration in time range)  morphine 4 MG/ML injection 4 mg (has no administration in time range)  ondansetron (ZOFRAN) injection 4 mg (has no administration in time range)     Initial Impression / Assessment and Plan / ED Course  I have reviewed the triage vital signs and the nursing notes.  Pertinent labs & imaging results that were available during my care of the patient were reviewed by me and considered in my medical decision making (see chart for details).  Patient presents with abdominal discomfort, mainly right-sided.  She had a slight white count, however CT scan shows no evidence for appendicitis.  It does show enteritis  that is thought to be the cause of her symptoms.  She had an abnormal gallbladder on the CT scan, however ultrasound does not suggest cholecystitis.  She is feeling better after IV fluids and medications.  She will be discharged with medicine for pain, nausea, and a potassium replacement.  Final Clinical Impressions(s) / ED Diagnoses   Final diagnoses:  None    ED Discharge Orders    None       Veryl Speak, MD 10/14/17 [phone]

## 2017-10-13 NOTE — ED Triage Notes (Signed)
Pt c/o right sided abdominal with nausea/vomiting/diarrhea x 1 day.

## 2017-10-14 ENCOUNTER — Emergency Department (HOSPITAL_COMMUNITY): Payer: Self-pay

## 2017-10-14 MED ORDER — IOHEXOL 300 MG/ML  SOLN
100.0000 mL | Freq: Once | INTRAMUSCULAR | Status: AC | PRN
  Administered 2017-10-14: 100 mL via INTRAVENOUS

## 2017-10-14 MED ORDER — POTASSIUM CHLORIDE CRYS ER 20 MEQ PO TBCR: 20.0000 meq | EXTENDED_RELEASE_TABLET | Freq: Two times a day (BID) | ORAL | 0 refills | Status: DC

## 2017-10-14 MED ORDER — ONDANSETRON 8 MG PO TBDP: ORAL_TABLET | ORAL | 0 refills | Status: DC

## 2017-10-14 MED ORDER — HYDROCODONE-ACETAMINOPHEN 5-325 MG PO TABS: 1.0000 | ORAL_TABLET | Freq: Four times a day (QID) | ORAL | 0 refills | Status: DC | PRN

## 2017-10-14 NOTE — ED Notes (Signed)
ED Provider at bedside. 

## 2017-10-14 NOTE — ED Notes (Signed)
Patient transported to CT 

## 2017-10-14 NOTE — Discharge Instructions (Addendum)
Potassium replacement as prescribed.  Hydrocodone is prescribed as needed for pain.  Zofran as prescribed as needed for nausea.  Return to the emergency department if you develop worsening pain, high fevers, bloody stools, or other new and concerning symptoms.

## 2017-10-14 NOTE — ED Notes (Signed)
Patient transported to Ultrasound 

## 2017-12-02 ENCOUNTER — Other Ambulatory Visit: Payer: Self-pay | Admitting: Obstetrics and Gynecology

## 2017-12-02 DIAGNOSIS — Z1231 Encounter for screening mammogram for malignant neoplasm of breast: Secondary | ICD-10-CM

## 2018-02-13 ENCOUNTER — Ambulatory Visit (HOSPITAL_COMMUNITY): Payer: Self-pay

## 2018-05-28 ENCOUNTER — Ambulatory Visit: Payer: Self-pay | Admitting: Internal Medicine

## 2019-01-05 ENCOUNTER — Other Ambulatory Visit: Payer: Self-pay

## 2019-01-05 ENCOUNTER — Encounter: Payer: Self-pay | Admitting: Nurse Practitioner

## 2019-01-05 ENCOUNTER — Ambulatory Visit: Payer: Medicaid Other | Attending: Nurse Practitioner | Admitting: Nurse Practitioner

## 2019-01-05 DIAGNOSIS — F331 Major depressive disorder, recurrent, moderate: Secondary | ICD-10-CM | POA: Insufficient documentation

## 2019-01-05 DIAGNOSIS — Z79899 Other long term (current) drug therapy: Secondary | ICD-10-CM | POA: Insufficient documentation

## 2019-01-05 DIAGNOSIS — Z130 Encounter for screening for diseases of the blood and blood-forming organs and certain disorders involving the immune mechanism: Secondary | ICD-10-CM

## 2019-01-05 DIAGNOSIS — Z13228 Encounter for screening for other metabolic disorders: Secondary | ICD-10-CM | POA: Insufficient documentation

## 2019-01-05 DIAGNOSIS — F4310 Post-traumatic stress disorder, unspecified: Secondary | ICD-10-CM

## 2019-01-05 DIAGNOSIS — Z13220 Encounter for screening for lipoid disorders: Secondary | ICD-10-CM | POA: Insufficient documentation

## 2019-01-05 DIAGNOSIS — I10 Essential (primary) hypertension: Secondary | ICD-10-CM

## 2019-01-05 MED ORDER — BLOOD PRESSURE MONITOR DEVI: 0 refills | Status: DC

## 2019-01-05 MED ORDER — HYDROCHLOROTHIAZIDE 25 MG PO TABS: 25.0000 mg | ORAL_TABLET | Freq: Every day | ORAL | 0 refills | Status: DC

## 2019-01-05 NOTE — Progress Notes (Signed)
Virtual Visit via Telephone Note Due to national recommendations of social distancing due to [place] 19, telehealth visit is felt to be most appropriate for this patient at this time.  I discussed the limitations, risks, security and privacy concerns of performing an evaluation and management service by telephone and the availability of in person appointments. I also discussed with the patient that there may be a patient responsible charge related to this service. The patient expressed understanding and agreed to proceed.    I connected with Catherine [name] on 01/05/19  at   8:50 AM EDT  EDT by telephone and verified that I am speaking with the correct person using two identifiers.   Consent I discussed the limitations, risks, security and privacy concerns of performing an evaluation and management service by telephone and the availability of in person appointments. I also discussed with the patient that there may be a patient responsible charge related to this service. The patient expressed understanding and agreed to proceed.   Location of Patient: Private Residence   Location of Provider: [place] and [employer] Office    Persons participating in Telemedicine visit: [name] FNP-BC [place]    History of Present Illness: Telemedicine visit for: Establish Care  has a past medical history of Advanced maternal age (AMA) in pregnancy, GERD (gastroesophageal reflux disease) (11/03/2012), Gestational diabetes, Gestational hypertension, Late prenatal care, Medical history non-contributory, and Preeclampsia.   Gestational Diabetes 6 years ago. She has never had a mammogram. Last PAP smear several years ago.   DEPRESSION She is currently seeing a private psychiatrist. Current medications include: Klonopin 0.5 mg, prozac 40 mg daily, HCTZ 25 mg daily, latuda 40 mg daily and remeron 15 mg at bedtime. Symptoms currently controlled.  Depression screen PHQ 2/9  01/05/2019  Decreased Interest 1  Down, Depressed, Hopeless 0  PHQ - 2 Score 1  Altered sleeping 1  Tired, decreased energy 0  Change in appetite 0  Feeling bad or failure about yourself  1  Trouble concentrating 0  Moving slowly or fidgety/restless 0  Suicidal thoughts 0  PHQ-9 Score 3    Essential Hypertension She takes HCTZ 25 mg. Does not monitor her blood pressure at home. Will send a monitor through her insurance. Denies chest pain, shortness of breath, palpitations, lightheadedness, dizziness, headaches or BLE edema. Currently taking HCTZ 25 mg daily.  BP Readings from Last 3 Encounters:  10/14/17 136/89  03/18/17 (!) 158/106  01/01/17 (!) 149/80      Past Medical History:  Diagnosis Date  . Advanced maternal age (AMA) in pregnancy   . GERD (gastroesophageal reflux disease) 11/03/2012  . Gestational diabetes    diet controlled  . Gestational hypertension   . Late prenatal care   . Medical history non-contributory   . Preeclampsia     Past Surgical History:  Procedure Laterality Date  . CESAREAN SECTION N/A 12/21/2012   Procedure: CESAREAN SECTION;  Surgeon: Florian Buff, MD;  Location: [facility];  Service: Obstetrics;  Laterality: N/A;  . LAPAROSCOPIC OVARIAN  2004    Family History  Problem Relation Age of Onset  . Alcohol abuse Neg Hx   . Arthritis Neg Hx   . Asthma Neg Hx   . Birth defects Neg Hx   . Cancer Neg Hx   . COPD Neg Hx   . Depression Neg Hx   . Diabetes Neg Hx   . Drug abuse Neg Hx   . Early death Neg Hx   .  Hearing loss Neg Hx   . Heart disease Neg Hx   . Hyperlipidemia Neg Hx   . Hypertension Neg Hx   . Kidney disease Neg Hx   . Learning disabilities Neg Hx   . Mental illness Neg Hx   . Mental retardation Neg Hx   . Miscarriages / Stillbirths Neg Hx   . Stroke Neg Hx   . Vision loss Neg Hx     Social History   Socioeconomic History  . Marital status: Single    Spouse name: Not on file  . Number of children: 1  . Years of  education: Not on file  . Highest education level: Not on file  Occupational History  . Not on file  Social Needs  . Financial resource strain: Not on file  . Food insecurity    Worry: Not on file    Inability: Not on file  . Transportation needs    Medical: Not on file    Non-medical: Not on file  Tobacco Use  . Smoking status: Never Smoker  . Smokeless tobacco: Never Used  Substance and Sexual Activity  . Alcohol use: No  . Drug use: No  . Sexual activity: Not Currently  Lifestyle  . Physical activity    Days per week: Not on file    Minutes per session: Not on file  . Stress: Not on file  Relationships  . Social Herbalist on phone: Not on file    Gets together: Not on file    Attends religious service: Not on file    Active member of club or organization: Not on file    Attends meetings of clubs or organizations: Not on file    Relationship status: Not on file  Other Topics Concern  . Not on file  Social History Narrative  . Not on file     Observations/Objective: Awake, alert and oriented x 3   Review of Systems  Constitutional: Negative for fever, malaise/fatigue and weight loss.  HENT: Negative.  Negative for nosebleeds.   Eyes: Negative.  Negative for blurred vision, double vision and photophobia.  Respiratory: Negative.  Negative for cough and shortness of breath.   Cardiovascular: Negative.  Negative for chest pain, palpitations and leg swelling.  Gastrointestinal: Negative.  Negative for heartburn, nausea and vomiting.  Musculoskeletal: Negative.  Negative for myalgias.  Neurological: Negative.  Negative for dizziness, focal weakness, seizures and headaches.  Psychiatric/Behavioral: Positive for depression. Negative for suicidal ideas.    Assessment and Plan:  Diagnoses and all orders for this visit:  Essential hypertension -     Blood Pressure Monitor DEVI; Please provide patient with insurance approved blood pressure monitor -      hydrochlorothiazide (HYDRODIURIL) 25 MG tablet; Take 1 tablet (25 mg total) by mouth daily.  Moderate episode of recurrent major depressive disorder ([place]) Continue follow up with psychiatrist     Follow Up Instructions Return for Fasting labs.     I discussed the assessment and treatment plan with the patient. The patient was provided an opportunity to ask questions and all were answered. The patient agreed with the plan and demonstrated an understanding of the instructions.   The patient was advised to call back or seek an in-person evaluation if the symptoms worsen or if the condition fails to improve as anticipated.  I provided 18 minutes of non-face-to-face time during this encounter including median intraservice time, reviewing previous notes, labs, imaging, medications and explaining diagnosis and  management.  Gildardo Pounds, FNP-BC

## 2019-01-05 NOTE — Progress Notes (Signed)
UTI sx's- pressure in pelvic, foul smelling urine, scanty urine, x 3days  Blood pressures medication refill

## 2019-01-08 ENCOUNTER — Other Ambulatory Visit: Payer: Self-pay

## 2019-01-08 ENCOUNTER — Ambulatory Visit: Payer: Medicaid Other | Attending: Nurse Practitioner

## 2019-01-08 DIAGNOSIS — Z13220 Encounter for screening for lipoid disorders: Secondary | ICD-10-CM

## 2019-01-08 DIAGNOSIS — F331 Major depressive disorder, recurrent, moderate: Secondary | ICD-10-CM

## 2019-01-08 DIAGNOSIS — Z13228 Encounter for screening for other metabolic disorders: Secondary | ICD-10-CM

## 2019-01-08 DIAGNOSIS — Z130 Encounter for screening for diseases of the blood and blood-forming organs and certain disorders involving the immune mechanism: Secondary | ICD-10-CM

## 2019-01-09 LAB — CBC
Hematocrit: 38 % (ref 34.0–46.6)
Hemoglobin: 12.7 g/dL (ref 11.1–15.9)
MCH: 28.9 pg (ref 26.6–33.0)
MCHC: 33.4 g/dL (ref 31.5–35.7)
MCV: 87 fL (ref 79–97)
Platelets: 289 10*3/uL (ref 150–450)
RBC: 4.39 x10E6/uL (ref 3.77–5.28)
RDW: 13.5 % (ref 11.7–15.4)
WBC: 7.3 10*3/uL (ref 3.4–10.8)

## 2019-01-09 LAB — CMP14+EGFR
ALT: 8 IU/L (ref 0–32)
AST: 14 IU/L (ref 0–40)
Albumin/Globulin Ratio: 1.3 (ref 1.2–2.2)
Albumin: 4 g/dL (ref 3.8–4.8)
Alkaline Phosphatase: 84 IU/L (ref 39–117)
BUN/Creatinine Ratio: 13 (ref 9–23)
BUN: 13 mg/dL (ref 6–24)
Bilirubin Total: 0.2 mg/dL (ref 0.0–1.2)
CO2: 25 mmol/L (ref 20–29)
Calcium: 9.4 mg/dL (ref 8.7–10.2)
Chloride: 101 mmol/L (ref 96–106)
Creatinine, Ser: 0.99 mg/dL (ref 0.57–1.00)
GFR calc Af Amer: 82 mL/min/{1.73_m2} (ref >59)
GFR calc non Af Amer: 71 mL/min/{1.73_m2} (ref >59)
Globulin, Total: 3 g/dL (ref 1.5–4.5)
Glucose: 110 mg/dL — ABNORMAL HIGH (ref 65–99)
Potassium: 4.5 mmol/L (ref 3.5–5.2)
Sodium: 140 mmol/L (ref 134–144)
Total Protein: 7 g/dL (ref 6.0–8.5)

## 2019-01-09 LAB — LIPID PANEL
Chol/HDL Ratio: 3.5 ratio (ref 0.0–4.4)
Cholesterol, Total: 152 mg/dL (ref 100–199)
HDL: 43 mg/dL (ref >39)
LDL Chol Calc (NIH): 89 mg/dL (ref 0–99)
Triglycerides: 111 mg/dL (ref 0–149)
VLDL Cholesterol Cal: 20 mg/dL (ref 5–40)

## 2019-01-09 LAB — TSH: TSH: 2.59 u[IU]/mL (ref 0.450–4.500)

## 2019-01-13 ENCOUNTER — Ambulatory Visit: Payer: Self-pay | Admitting: Family Medicine

## 2019-02-17 ENCOUNTER — Ambulatory Visit: Payer: Medicaid Other | Admitting: Nurse Practitioner

## 2019-03-17 ENCOUNTER — Ambulatory Visit: Payer: Medicaid Other | Admitting: Nurse Practitioner

## 2019-05-27 ENCOUNTER — Other Ambulatory Visit: Payer: Self-pay | Admitting: Nurse Practitioner

## 2019-05-27 DIAGNOSIS — I10 Essential (primary) hypertension: Secondary | ICD-10-CM

## 2019-06-01 ENCOUNTER — Telehealth: Payer: Self-pay | Admitting: Family Medicine

## 2019-06-01 NOTE — Telephone Encounter (Signed)
-----   Message from Azzie Glatter, [place] sent at 05/31/2019  1:10 PM EDT ----- Regarding: "New PCP?" Patient is scheduled with me 06/2019. Please assess if patient is still sees [name], NP at [facility]. Thank you.

## 2019-06-01 NOTE — Telephone Encounter (Signed)
I called pt. No answer. Unable to leave a message.

## 2019-06-08 ENCOUNTER — Telehealth: Payer: Self-pay | Admitting: Family Medicine

## 2019-06-08 NOTE — Telephone Encounter (Signed)
Called pt again to clarify pcp. No answer. Unable to leave a message.

## 2019-06-11 ENCOUNTER — Other Ambulatory Visit: Payer: Self-pay | Admitting: Family Medicine

## 2019-06-11 ENCOUNTER — Telehealth: Payer: Self-pay | Admitting: Nurse Practitioner

## 2019-06-11 DIAGNOSIS — I10 Essential (primary) hypertension: Secondary | ICD-10-CM

## 2019-06-11 MED ORDER — HYDROCHLOROTHIAZIDE 25 MG PO TABS: 25.0000 mg | ORAL_TABLET | Freq: Every day | ORAL | 0 refills | Status: DC

## 2019-06-11 NOTE — Telephone Encounter (Signed)
Prescription sent

## 2019-06-11 NOTE — Telephone Encounter (Signed)
1) Medication(s) Requested (by name):hydrochlorothiazide (HYDRODIURIL) 25 MG    2) Pharmacy of Choice:[facility], [address] AT [address],   3) Special Requests:   Approved medications will be sent to the pharmacy, we will reach out if there is an issue.  Requests made after 3pm may not be addressed until the following business day!  If a patient is unsure of the name of the medication(s) please note and ask patient to call back when they are able to provide all info, do not send to responsible party until all information is available!

## 2019-06-23 IMAGING — CT CT ABD-PELV W/ CM
2 of 4 series · 15 of 46 positions shown, 17 images · IV contrast (omnipaque)
Comparison: None

CLINICAL DATA: Right-sided abdominal pain with nausea, vomiting and
diarrhea x1 day.

EXAM:
CT ABDOMEN AND PELVIS WITH CONTRAST
TECHNIQUE: Multidetector CT imaging of the abdomen and pelvis was performed
using the standard protocol following bolus administration of
intravenous contrast.
CONTRAST:  100mL OMNIPAQUE IOHEXOL 300 MG/ML  SOLN

[Series 3: abdomen 5.0 · axial · 0.72mm/px · z∈[-131,+259]mm · 12 of 88 slices shown, 14 images]
[im 5/88  soft-tissue]
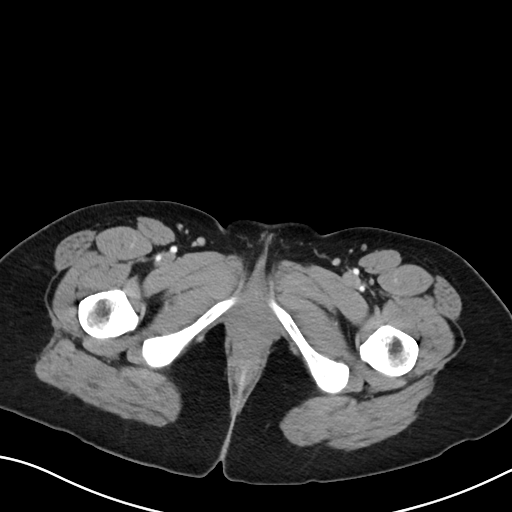
[im 5/88  bone]
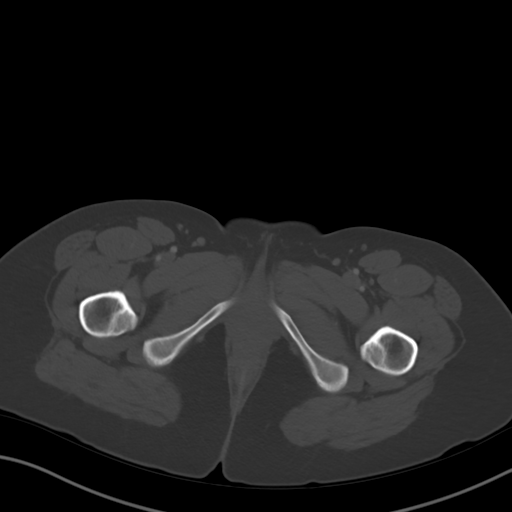
[im 15/88  soft-tissue]
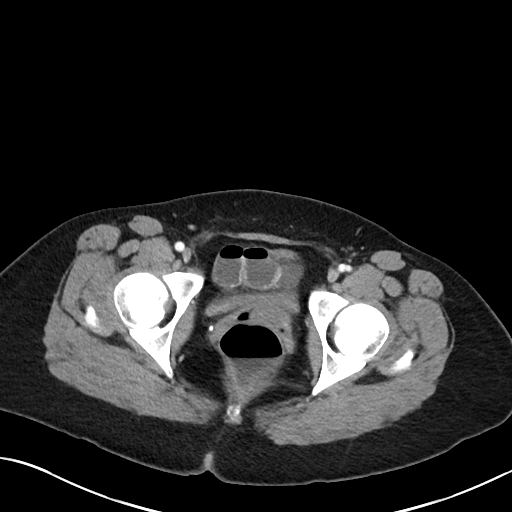
[im 20/88  soft-tissue]
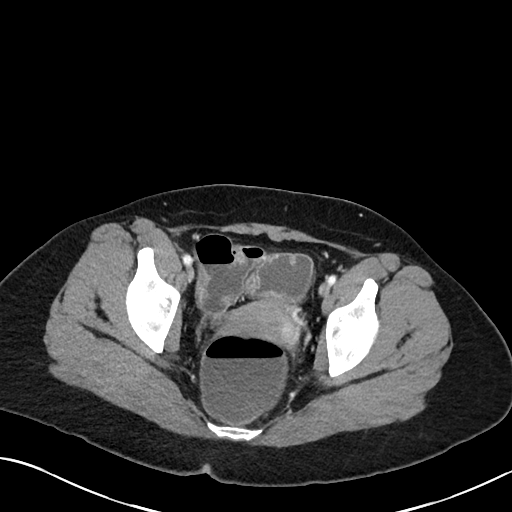
[im 25/88  soft-tissue]
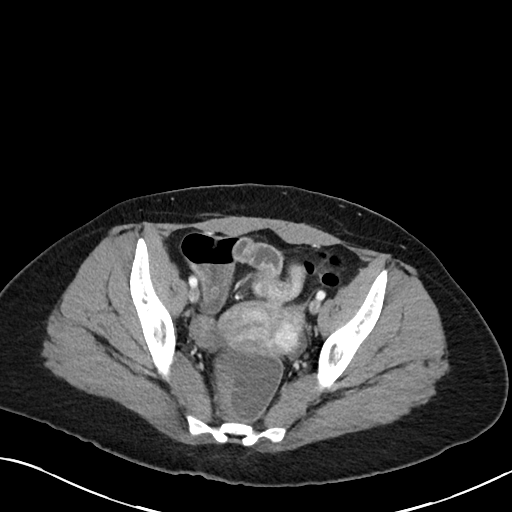
[im 34/88  soft-tissue]
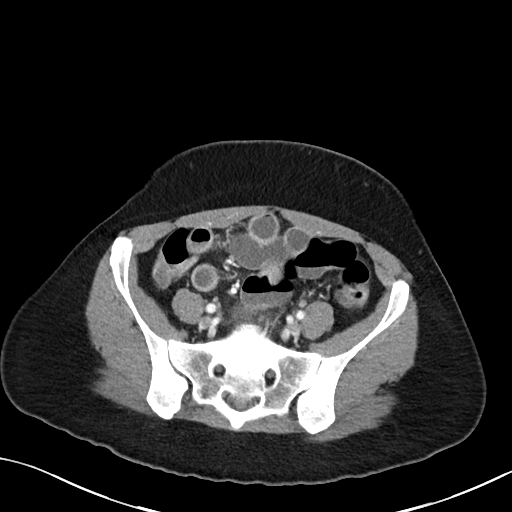
[im 39/88  soft-tissue]
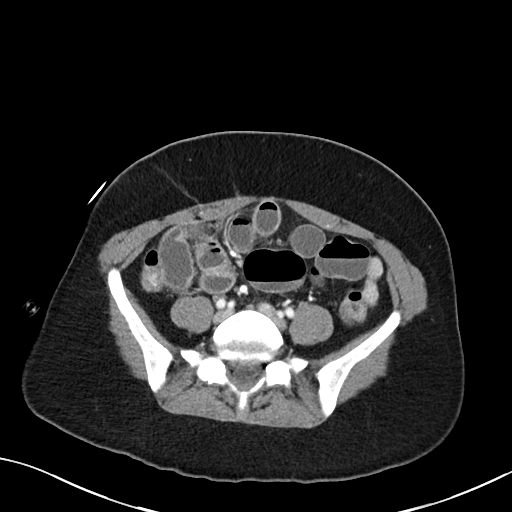
[im 49/88  soft-tissue]
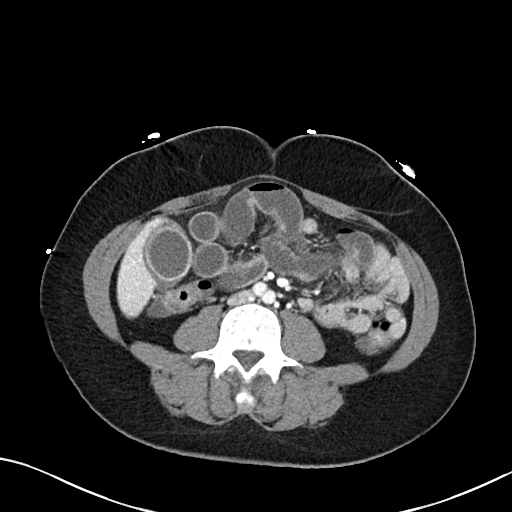
[im 54/88  soft-tissue]
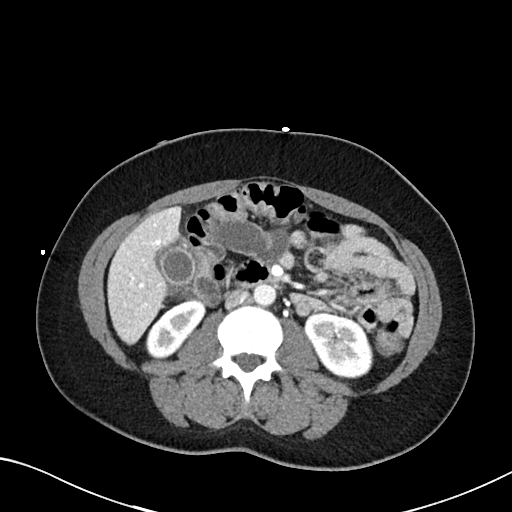
[im 63/88  soft-tissue]
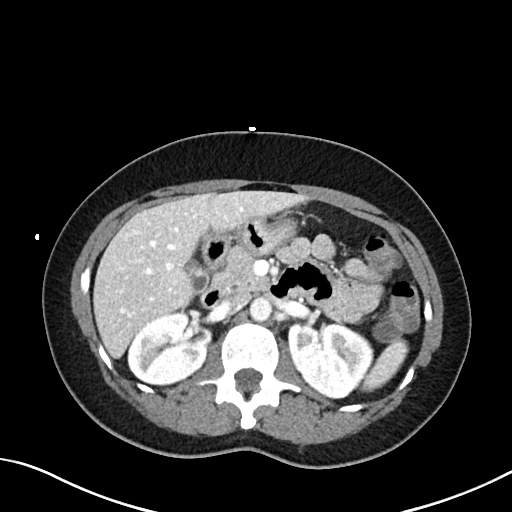
[im 63/88  bone]
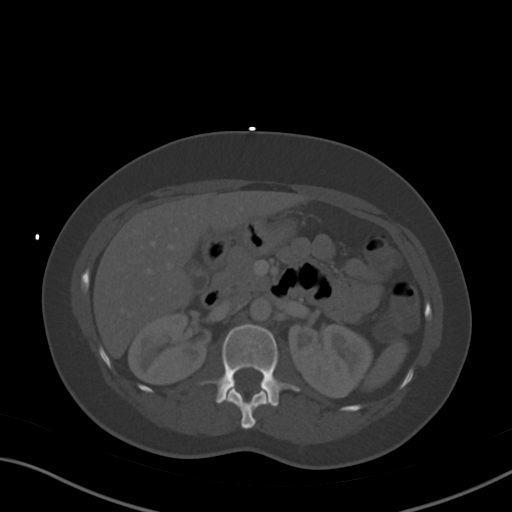
[im 68/88  soft-tissue]
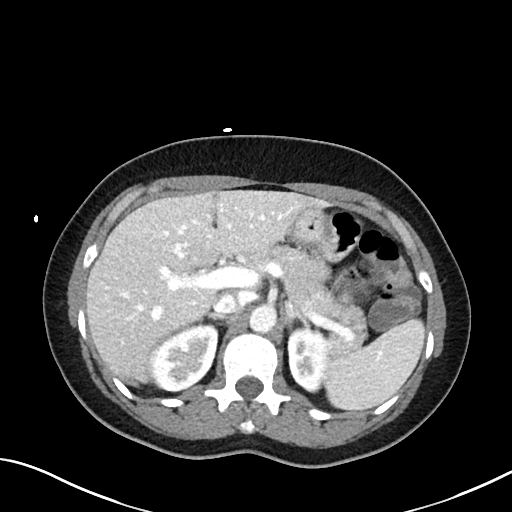
[im 73/88  soft-tissue]
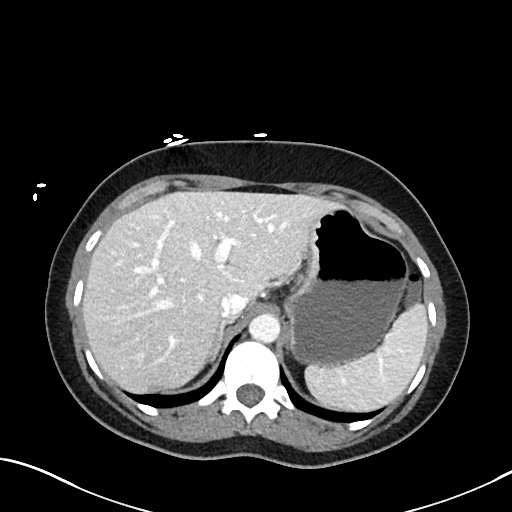
[im 83/88  soft-tissue]
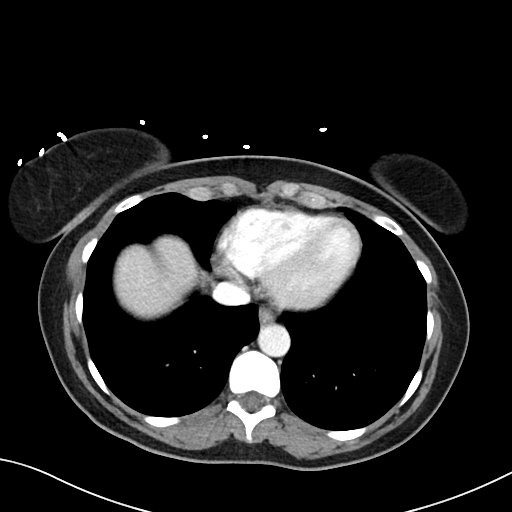

[Series 6: abdomen 3.0 mpr cor · coronal · 0.69mm/px · 3 of 100 slices shown]
[im 34/100  soft-tissue]
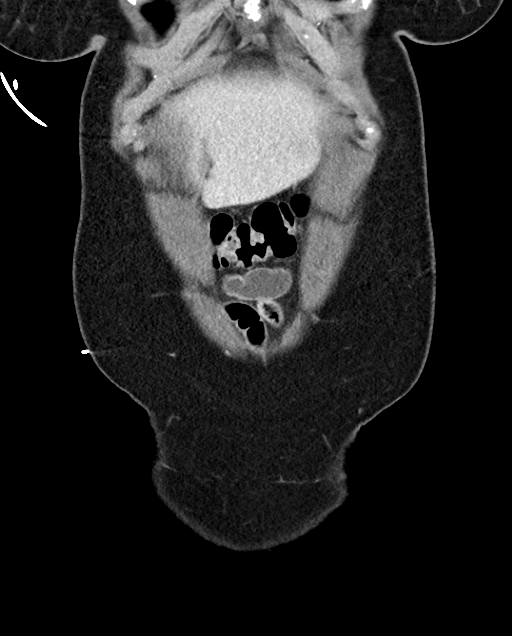
[im 45/100  soft-tissue]
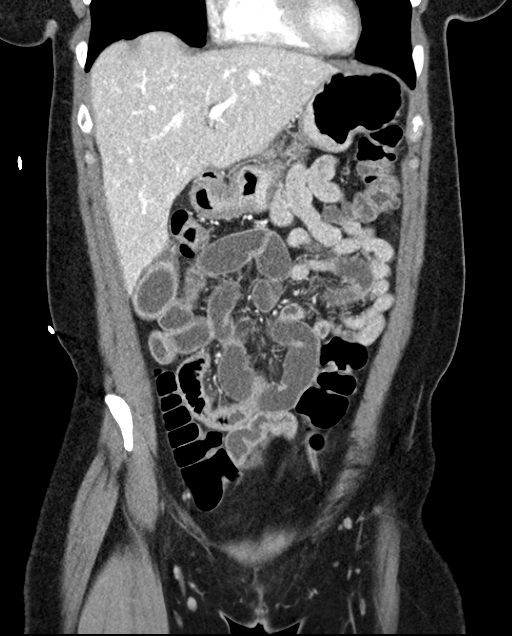
[im 56/100  soft-tissue]
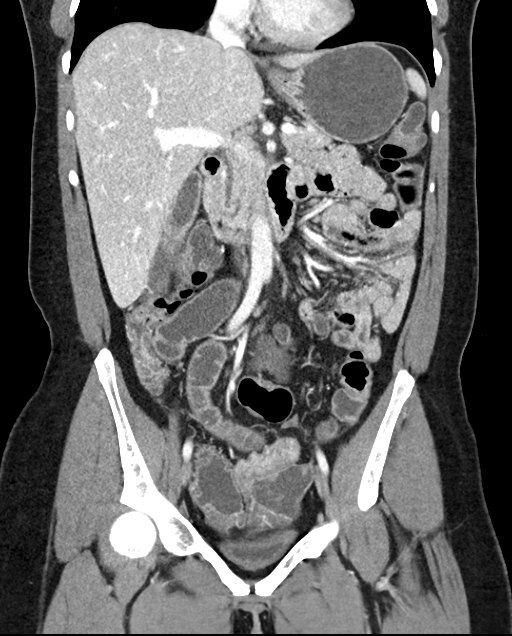

[15 of 46 positions shown; findings below may reference images not displayed]

FINDINGS: Lower chest: Heart size is normal without pericardial effusion. Lung
bases are clear with minimal subsegmental atelectasis in the lower
lobes bilaterally.

Hepatobiliary: An 11 mm hypervascular focus in the caudal right
hepatic lobe is identified with a hypodense 5 mm focus more cephalad
within the right hepatic lobe. These are nonspecific but could
potentially represent hemangiomata with flash filling of a
hemangioma accounting for the 11 mm hypervascular blush seen. No
biliary dilatation is identified. The gallbladder is slightly
thick-walled in appearance with hyperenhancing mucosa. No gallstones
are visualized. This may be reactive in sympathetic in etiology.
Cholecystitis is not entirely excluded but believed less likely.

Pancreas: Normal

Spleen: Normal

Adrenals/Urinary Tract: Normal bilateral adrenal glands.
Approximately 10 x 10 x 8 mm cyst in the interpolar right kidney is
identified. No worrisome features are noted. The urinary bladder is
unremarkable for the degree of distention.

Stomach/Bowel: The stomach is physiologically distended with
ingested food and fluid. Normal small bowel rotation is visualized.
Fluid-filled distention of distal jejunum and ileum are identified
to the level of the terminal and distal ileum which are contracted
in appearance. The cecum is situated within the pelvis. No definite
evidence of acute appendicitis. Liquid stool is noted within the
colon to the level of the rectum.

Vascular/Lymphatic: No significant vascular findings are present. No
enlarged abdominal or pelvic lymph nodes.

Reproductive: Heterogeneously enhancing 2.9 x 3 x 2.8 cm
predominantly intramural fibroid is noted slightly touching upon the
adjacent endometrial stripe. No adnexal mass is visualized.

Other: No free air nor free fluid.

Musculoskeletal: No acute or significant osseous findings.
IMPRESSION: 1. Predominant finding of small and large bowel fluid-filled
distention suspicious for small bowel enteritis with diarrheal
disease. No mechanical bowel obstruction is seen.
2. Somewhat thick-walled, partially distended gallbladder is more
likely sympathetic in etiology. Acalculous cholecystitis is not
entirely excluded. Consider ultrasound for further correlation
clinically believed necessary.
3. 2.9 x 3 x 2.8 cm intramural uterine fibroid.
4. Nonspecific hypervascular focus in the right hepatic lobe
possibly representing flash filling of a hepatic hemangioma
measuring 11 mm with a smaller hypodense 5 mm nonspecific focus too
small to further characterize also within the right hepatic lobe.

## 2019-06-26 ENCOUNTER — Ambulatory Visit: Payer: Self-pay | Admitting: Family Medicine

## 2019-06-30 ENCOUNTER — Other Ambulatory Visit: Payer: Self-pay

## 2019-06-30 ENCOUNTER — Ambulatory Visit (HOSPITAL_COMMUNITY)
Admission: EM | Admit: 2019-06-30 | Discharge: 2019-06-30 | Disposition: A | Discharge status: Home / Self Care | Payer: Medicaid Other | Attending: Family Medicine | Admitting: Family Medicine

## 2019-06-30 ENCOUNTER — Encounter (HOSPITAL_COMMUNITY): Payer: Self-pay

## 2019-06-30 ENCOUNTER — Ambulatory Visit: Payer: Medicaid Other | Admitting: Nurse Practitioner

## 2019-06-30 DIAGNOSIS — J0100 Acute maxillary sinusitis, unspecified: Secondary | ICD-10-CM | POA: Diagnosis not present

## 2019-06-30 MED ORDER — AMOXICILLIN-POT CLAVULANATE 875-125 MG PO TABS: 1.0000 | ORAL_TABLET | Freq: Two times a day (BID) | ORAL | 0 refills | Status: DC

## 2019-06-30 MED ORDER — IPRATROPIUM BROMIDE 0.03 % NA SOLN: 2.0000 | Freq: Two times a day (BID) | NASAL | 0 refills | Status: DC

## 2019-06-30 NOTE — ED Triage Notes (Signed)
Pt presents with watery eyes, nasal congestion, facial pressure & pain X 1 month with no relief with OTC medication.

## 2019-06-30 NOTE — ED Provider Notes (Signed)
[place]    CSN: [id number] Arrival date & time: 06/30/19  1907      History   Chief Complaint Chief Complaint  Patient presents with  . Sinus Congestion  . Facial Pressure/Pain  . Watery Eyes    HPI Catherine [name] is a 44 y.o. female.   HPI Patient presents 4 weeks of nasal congestion, facial pressure, throat pain and headache.  Denies any history of recurrent upper respiratory infections. She is taking multiple over-the-counter preparations without improvement of symptoms.  Denies any recent sick contacts. She noticed today bilateral lower eye swelling and worsening facial pain.  Past Medical History:  Diagnosis Date  . Advanced maternal age (AMA) in pregnancy   . GERD (gastroesophageal reflux disease) 11/03/2012  . Gestational diabetes    diet controlled  . Gestational hypertension   . Late prenatal care   . Medical history non-contributory   . Preeclampsia     Patient Active Problem List   Diagnosis Date Noted  . Moderate episode of recurrent major depressive disorder ([place]) 01/05/2019  . Essential hypertension 01/05/2019  . PTSD (post-traumatic stress disorder) 03/18/2017    Past Surgical History:  Procedure Laterality Date  . CESAREAN SECTION N/A 12/21/2012   Procedure: CESAREAN SECTION;  Surgeon: Florian Buff, MD;  Location: [facility];  Service: Obstetrics;  Laterality: N/A;  . LAPAROSCOPIC OVARIAN  2004    OB History    Gravida  1   Para  1   Term  1   Preterm  0   AB  0   Living  1     SAB  0   TAB  0   Ectopic  0   Multiple  0   Live Births  1            Home Medications    Prior to Admission medications   Medication Sig Start Date End Date Taking? Authorizing Provider  acetaminophen (TYLENOL) 325 MG tablet Take 325-650 mg by mouth every 6 (six) hours as needed for mild pain or headache.    [provider]  Blood Pressure Monitor DEVI Please provide patient with insurance approved blood pressure  monitor 01/05/19   Gildardo Pounds, NP  clonazePAM (KLONOPIN) 0.5 MG tablet Take 0.5 mg by mouth as needed for anxiety.    [provider]  FLUoxetine (PROZAC) 40 MG capsule Take 40 mg by mouth daily.    [provider]  hydrochlorothiazide (HYDRODIURIL) 25 MG tablet Take 1 tablet (25 mg total) by mouth daily. 06/11/19 07/11/19  Charlott Rakes, MD  lurasidone (LATUDA) 40 MG TABS tablet Take 40 mg by mouth daily with breakfast.    [provider]  mirtazapine (REMERON) 15 MG tablet Take 15 mg by mouth at bedtime.    [provider]  prazosin (MINIPRESS) 5 MG capsule Take 2 mg by mouth at bedtime. Takes 4 mg HS    [provider]    Family History Family History  Problem Relation Age of Onset  . Alcohol abuse Neg Hx   . Arthritis Neg Hx   . Asthma Neg Hx   . Birth defects Neg Hx   . Cancer Neg Hx   . COPD Neg Hx   . Depression Neg Hx   . Diabetes Neg Hx   . Drug abuse Neg Hx   . Early death Neg Hx   . Hearing loss Neg Hx   . Heart disease Neg Hx   . Hyperlipidemia Neg  Hx   . Hypertension Neg Hx   . Kidney disease Neg Hx   . Learning disabilities Neg Hx   . Mental illness Neg Hx   . Mental retardation Neg Hx   . Miscarriages / Stillbirths Neg Hx   . Stroke Neg Hx   . Vision loss Neg Hx     Social History Social History   Tobacco Use  . Smoking status: Never Smoker  . Smokeless tobacco: Never Used  Substance Use Topics  . Alcohol use: No  . Drug use: No     Allergies   Patient has no known allergies.  Review of Systems Review of Systems Pertinent negatives listed in HPI Physical Exam Triage Vital Signs ED Triage Vitals  Enc Vitals Group     BP 06/30/19 2009 140/80     Pulse Rate 06/30/19 2009 86     Resp 06/30/19 2009 17     Temp 06/30/19 2009 98.5 F (36.9 C)     Temp Source 06/30/19 2009 Oral     SpO2 06/30/19 2009 100 %     Weight --      Height --      Head Circumference --      Peak Flow --      Pain Score  06/30/19 2008 6     Pain Loc --      Pain Edu? --      Excl. in [place]? --    No data found.  Updated Vital Signs BP 140/80 (BP Location: Right Arm)   Pulse 86   Temp 98.5 F (36.9 C) (Oral)   Resp 17   SpO2 100%   Visual Acuity Right Eye Distance:   Left Eye Distance:   Bilateral Distance:    Right Eye Near:   Left Eye Near:    Bilateral Near:     Physical Exam Constitutional:      Appearance: She is ill-appearing.  HENT:     Right Ear: A middle ear effusion is present.     Left Ear: A middle ear effusion is present.     Nose: Mucosal edema present.     Right Turbinates: Swollen.     Left Turbinates: Swollen.     Right Sinus: Maxillary sinus tenderness present.     Left Sinus: Maxillary sinus tenderness present.  Cardiovascular:     Rate and Rhythm: Normal rate and regular rhythm.  Pulmonary:     Effort: Pulmonary effort is normal.     Breath sounds: Normal breath sounds.  Skin:    General: Skin is warm and dry.  Neurological:     Mental Status: She is alert and oriented to person, place, and time.  Psychiatric:        Attention and Perception: Attention normal.        Mood and Affect: Mood normal.    UC Treatments / Results  Labs (all labs ordered are listed, but only abnormal results are displayed) Labs Reviewed - No data to display  EKG   Radiology No results found.  Procedures Procedures (including critical care time)  Medications Ordered in UC Medications - No data to display  Initial Impression / Assessment and Plan / UC Course  I have reviewed the triage vital signs and the nursing notes.  Pertinent labs & imaging results that were available during my care of the patient were reviewed by me and considered in my medical decision making (see chart for details).    Acute non-recurrent maxillary  sinusitis Start Augmentin 1 tablet every 12 hours x 10 days.  Start over the counter Cetrizine 10 mg once daily at bedtime or Levocetirizine 5 mg at  bedtime( whichever is most cost effective).    Ipratropium (Atrovent) 2 sprays, twice daily. For appropriate administration of the nasal spray, clear the nose, use opposite hand for opposite nare, sniff gently, exhale through your mouth.     Final Clinical Impressions(s) / UC Diagnoses   Final diagnoses:  Acute non-recurrent maxillary sinusitis   Discharge Instructions   None    ED Prescriptions    Medication Sig Dispense Auth. Provider   amoxicillin-clavulanate (AUGMENTIN) 875-125 MG tablet Take 1 tablet by mouth 2 (two) times daily. 20 tablet Scot Jun, FNP   ipratropium (ATROVENT) 0.03 % nasal spray Place 2 sprays into both nostrils 2 (two) times daily. 30 mL Scot Jun, FNP     PDMP not reviewed this encounter.   Scot Jun, [place] 06/30/19 [phone]

## 2019-09-08 ENCOUNTER — Other Ambulatory Visit: Payer: Self-pay | Admitting: Pharmacist

## 2019-09-08 DIAGNOSIS — I10 Essential (primary) hypertension: Secondary | ICD-10-CM

## 2019-09-08 MED ORDER — HYDROCHLOROTHIAZIDE 25 MG PO TABS: 25.0000 mg | ORAL_TABLET | Freq: Every day | ORAL | 0 refills | Status: DC

## 2019-09-24 ENCOUNTER — Ambulatory Visit: Payer: Medicaid Other | Admitting: Physician Assistant

## 2019-10-08 ENCOUNTER — Other Ambulatory Visit: Payer: Self-pay

## 2019-10-08 ENCOUNTER — Ambulatory Visit (HOSPITAL_COMMUNITY)
Admission: EM | Admit: 2019-10-08 | Discharge: 2019-10-08 | Disposition: A | Discharge status: Home / Self Care | Payer: Medicaid Other | Attending: Internal Medicine | Admitting: Internal Medicine

## 2019-10-08 ENCOUNTER — Encounter (HOSPITAL_COMMUNITY): Payer: Self-pay

## 2019-10-08 DIAGNOSIS — A084 Viral intestinal infection, unspecified: Secondary | ICD-10-CM

## 2019-10-08 NOTE — ED Triage Notes (Signed)
Pt presents for note to return to work after being nauseas and vomiting for a few days.

## 2019-10-11 NOTE — ED Provider Notes (Signed)
[facility]    CSN: [id number] Arrival date & time: 10/08/19  1804      History   Chief Complaint Chief Complaint  Patient presents with  . Return to Work Note    HPI Catherine [name] is a 44 y.o. female seen in urgent care for return to work.  Patient had gastroenteritis which is currently resolved.  HPI  Past Medical History:  Diagnosis Date  . Advanced maternal age (AMA) in pregnancy   . GERD (gastroesophageal reflux disease) 11/03/2012  . Gestational diabetes    diet controlled  . Gestational hypertension   . Late prenatal care   . Medical history non-contributory   . Preeclampsia     Patient Active Problem List   Diagnosis Date Noted  . Moderate episode of recurrent major depressive disorder ([place]) 01/05/2019  . Essential hypertension 01/05/2019  . PTSD (post-traumatic stress disorder) 03/18/2017    Past Surgical History:  Procedure Laterality Date  . CESAREAN SECTION N/A 12/21/2012   Procedure: CESAREAN SECTION;  Surgeon: Florian Buff, MD;  Location: [facility];  Service: Obstetrics;  Laterality: N/A;  . LAPAROSCOPIC OVARIAN  2004    OB History    Gravida  1   Para  1   Term  1   Preterm  0   AB  0   Living  1     SAB  0   TAB  0   Ectopic  0   Multiple  0   Live Births  1            Home Medications    Prior to Admission medications   Medication Sig Start Date End Date Taking? Authorizing Provider  acetaminophen (TYLENOL) 325 MG tablet Take 325-650 mg by mouth every 6 (six) hours as needed for mild pain or headache.    [provider]  Blood Pressure Monitor DEVI Please provide patient with insurance approved blood pressure monitor 01/05/19   Gildardo Pounds, NP  clonazePAM (KLONOPIN) 0.5 MG tablet Take 0.5 mg by mouth as needed for anxiety.    [provider]  FLUoxetine (PROZAC) 40 MG capsule Take 40 mg by mouth daily.    [provider]  hydrochlorothiazide (HYDRODIURIL) 25 MG tablet Take  1 tablet (25 mg total) by mouth daily for 14 days. 09/08/19 09/22/19  Charlott Rakes, MD  ipratropium (ATROVENT) 0.03 % nasal spray Place 2 sprays into both nostrils 2 (two) times daily. 06/30/19   Scot Jun, FNP  lurasidone (LATUDA) 40 MG TABS tablet Take 40 mg by mouth daily with breakfast.    [provider]  mirtazapine (REMERON) 15 MG tablet Take 15 mg by mouth at bedtime.    [provider]  prazosin (MINIPRESS) 5 MG capsule Take 2 mg by mouth at bedtime. Takes 4 mg HS    [provider]    Family History Family History  Problem Relation Age of Onset  . Alcohol abuse Neg Hx   . Arthritis Neg Hx   . Asthma Neg Hx   . Birth defects Neg Hx   . Cancer Neg Hx   . COPD Neg Hx   . Depression Neg Hx   . Diabetes Neg Hx   . Drug abuse Neg Hx   . Early death Neg Hx   . Hearing loss Neg Hx   . Heart disease Neg Hx   . Hyperlipidemia Neg Hx   . Hypertension Neg Hx   . Kidney disease Neg  Hx   . Learning disabilities Neg Hx   . Mental illness Neg Hx   . Mental retardation Neg Hx   . Miscarriages / Stillbirths Neg Hx   . Stroke Neg Hx   . Vision loss Neg Hx     Social History Social History   Tobacco Use  . Smoking status: Never Smoker  . Smokeless tobacco: Never Used  Substance Use Topics  . Alcohol use: No  . Drug use: No     Allergies   Patient has no known allergies.   Review of Systems Review of Systems   Physical Exam Triage Vital Signs ED Triage Vitals  Enc Vitals Group     BP 10/08/19 1846 (!) 160/94     Pulse Rate 10/08/19 1846 79     Resp 10/08/19 1846 18     Temp 10/08/19 1846 98 F (36.7 C)     Temp Source 10/08/19 1846 Oral     SpO2 10/08/19 1846 100 %     Weight --      Height --      Head Circumference --      Peak Flow --      Pain Score 10/08/19 1845 0     Pain Loc --      Pain Edu? --      Excl. in [place]? --    No data found.  Updated Vital Signs BP (!) 160/94 (BP Location: Right Arm)   Pulse 79    Temp 98 F (36.7 C) (Oral)   Resp 18   SpO2 100%   Visual Acuity Right Eye Distance:   Left Eye Distance:   Bilateral Distance:    Right Eye Near:   Left Eye Near:    Bilateral Near:     Physical Exam   UC Treatments / Results  Labs (all labs ordered are listed, but only abnormal results are displayed) Labs Reviewed - No data to display  EKG   Radiology No results found.  Procedures Procedures (including critical care time)  Medications Ordered in UC Medications - No data to display  Initial Impression / Assessment and Plan / UC Course  I have reviewed the triage vital signs and the nursing notes.  Pertinent labs & imaging results that were available during my care of the patient were reviewed by me and considered in my medical decision making (see chart for details).     1.  Return to work note: Return to work note given Recent resolved gastroenteritis.   Final Clinical Impressions(s) / UC Diagnoses   Final diagnoses:  Viral gastroenteritis   Discharge Instructions   None    ED Prescriptions    None     PDMP not reviewed this encounter.   Chase Picket, MD 10/11/19 [phone]

## 2019-10-15 ENCOUNTER — Other Ambulatory Visit: Payer: Self-pay

## 2019-10-15 ENCOUNTER — Ambulatory Visit: Payer: Medicaid Other | Attending: Physician Assistant | Admitting: Physician Assistant

## 2019-10-15 ENCOUNTER — Encounter: Payer: Self-pay | Admitting: Physician Assistant

## 2019-10-15 DIAGNOSIS — Z09 Encounter for follow-up examination after completed treatment for conditions other than malignant neoplasm: Secondary | ICD-10-CM

## 2019-10-15 DIAGNOSIS — F331 Major depressive disorder, recurrent, moderate: Secondary | ICD-10-CM

## 2019-10-15 DIAGNOSIS — I10 Essential (primary) hypertension: Secondary | ICD-10-CM

## 2019-10-15 DIAGNOSIS — R739 Hyperglycemia, unspecified: Secondary | ICD-10-CM

## 2019-10-15 MED ORDER — PRAZOSIN HCL 5 MG PO CAPS: 5.0000 mg | ORAL_CAPSULE | Freq: Every day | ORAL | 2 refills | Status: DC

## 2019-10-15 MED ORDER — HYDROCHLOROTHIAZIDE 25 MG PO TABS: 25.0000 mg | ORAL_TABLET | Freq: Every day | ORAL | 2 refills | Status: DC

## 2019-10-15 NOTE — Progress Notes (Signed)
Virtual Visit via Telephone Note  I connected with Catherine [name] on 10/15/19 at  8:30 AM EDT by telephone and verified that I am speaking with the correct person using two identifiers.   I discussed the limitations, risks, security and privacy concerns of performing an evaluation and management service by telephone and the availability of in person appointments. I also discussed with the patient that there may be a patient responsible charge related to this service. The patient expressed understanding and agreed to proceed.  PATIENT visit by telephone virtually in the context of Covid-19 pandemic. Patient location:  home My Location:  [place] office Persons on the call:  Me and the patient   History of Present Illness: Seen in UC 7/29 with resolving GI bug bc she needed a work note.  Her illness has completely resolved.  She needs RF of BP meds.  When she checks her BP OOO, she gets ~130/80s.  She denies HA/CP/dizziness.  Depression has been stable.  No SI/HI.    We discussed hyperglycemia on previous labs and working on diet.     Observations/Objective: NAD.  A&Ox3  Assessment and Plan: 1. Essential hypertension Controlled based on OOO numbers - hydrochlorothiazide (HYDRODIURIL) 25 MG tablet; Take 1 tablet (25 mg total) by mouth daily for 14 days.  Dispense: 30 tablet; Refill: 2 - prazosin (MINIPRESS) 5 MG capsule; Take 1 capsule (5 mg total) by mouth at bedtime.  Dispense: 30 capsule; Refill: 2  2. Moderate episode of recurrent major depressive disorder (HCC) Stable;  continue - prazosin (MINIPRESS) 5 MG capsule; Take 1 capsule (5 mg total) by mouth at bedtime.  Dispense: 30 capsule; Refill: 2  3. Hyperglycemia I have had a lengthy discussion and provided education about insulin resistance and the intake of too much sugar/refined carbohydrates.  I have advised the patient to work at a goal of eliminating sugary drinks, candy, desserts, sweets, refined sugars, processed foods, and  white carbohydrates.  The patient expresses understanding.    4. Encounter for examination following treatment at hospital S/sx resolved    Follow Up Instructions: See PCP in 6-8 weeks and will need blood work   I discussed the assessment and treatment plan with the patient. The patient was provided an opportunity to ask questions and all were answered. The patient agreed with the plan and demonstrated an understanding of the instructions.   The patient was advised to call back or seek an in-person evaluation if the symptoms worsen or if the condition fails to improve as anticipated.  I provided 9 minutes of non-face-to-face time during this encounter.   Freeman Caldron, PA-C  Patient ID: Catherine [name], female   DOB: May 10, 1975, 44 y.o.   MRN: [id number]

## 2020-01-18 ENCOUNTER — Other Ambulatory Visit: Payer: Self-pay | Admitting: Family Medicine

## 2020-01-18 DIAGNOSIS — I10 Essential (primary) hypertension: Secondary | ICD-10-CM

## 2020-01-18 NOTE — Telephone Encounter (Signed)
Requested medication (s) are due for refill today: -  Requested medication (s) are on the active medication list: expired  Last refill:  10/15/19  Future visit scheduled: no  Notes to clinic:  prescription expired 10/29/19   Requested Prescriptions  Pending Prescriptions Disp Refills   hydrochlorothiazide (HYDRODIURIL) 25 MG tablet [Pharmacy Med Name: HYDROCHLOROTHIAZIDE 25MG  TABLETS] 14 tablet     Sig: TAKE 1 TABLET(25 MG) BY MOUTH DAILY FOR 14 DAYS      Cardiovascular: Diuretics - Thiazide Failed - 01/18/2020  9:09 AM      Failed - Ca in normal range and within 360 days    Calcium  Date Value Ref Range Status  01/08/2019 9.4 8.7 - 10.2 mg/dL Final          Failed - Cr in normal range and within 360 days    Creat  Date Value Ref Range Status  12/11/2012 0.59 0.50 - 1.10 mg/dL Final   Creatinine, Ser  Date Value Ref Range Status  01/08/2019 0.99 0.57 - 1.00 mg/dL Final   Creatinine, Urine  Date Value Ref Range Status  12/27/2012 76.76 mg/dL Final          Failed - K in normal range and within 360 days    Potassium  Date Value Ref Range Status  01/08/2019 4.5 3.5 - 5.2 mmol/L Final          Failed - Na in normal range and within 360 days    Sodium  Date Value Ref Range Status  01/08/2019 140 134 - 144 mmol/L Final          Failed - Last BP in normal range    BP Readings from Last 1 Encounters:  10/08/19 (!) 160/94          Passed - Valid encounter within last 6 months    Recent Outpatient Visits           3 months ago Hyperglycemia   [facility], [place], [place]   1 year ago Essential hypertension   [place], Vernia Buff, NP

## 2020-02-23 ENCOUNTER — Other Ambulatory Visit: Payer: Self-pay | Admitting: Nurse Practitioner

## 2020-02-23 DIAGNOSIS — I10 Essential (primary) hypertension: Secondary | ICD-10-CM

## 2020-02-23 MED ORDER — HYDROCHLOROTHIAZIDE 25 MG PO TABS: 25.0000 mg | ORAL_TABLET | Freq: Every day | ORAL | 0 refills | Status: DC

## 2020-02-23 NOTE — Telephone Encounter (Signed)
hydrochlorothiazide (HYDRODIURIL) 25 MG tablet Medication Expired Date: 10/15/2019 Department: [facility] Ordering/Authorizing: [facility] Dad    Order Providers  Prescribing Provider Encounter Provider  Morgan, Dionne Bucy, PA-C Argentina Donovan, PA-C   Pt states had med refill visit in August and wants a refill on med, if not able to fill pt wants a cb  [facility] [id number] - [place], [address] Phone:  [phone]  Fax:  [phone]

## 2020-02-23 NOTE — Telephone Encounter (Signed)
   Notes to clinic:  script has expired Review for use and refill    Requested Prescriptions  Pending Prescriptions Disp Refills   hydrochlorothiazide (HYDRODIURIL) 25 MG tablet 30 tablet 2    Sig: Take 1 tablet (25 mg total) by mouth daily for 14 days.      Cardiovascular: Diuretics - Thiazide Failed - 02/23/2020  9:59 AM      Failed - Ca in normal range and within 360 days    Calcium  Date Value Ref Range Status  01/08/2019 9.4 8.7 - 10.2 mg/dL Final          Failed - Cr in normal range and within 360 days    Creat  Date Value Ref Range Status  12/11/2012 0.59 0.50 - 1.10 mg/dL Final   Creatinine, Ser  Date Value Ref Range Status  01/08/2019 0.99 0.57 - 1.00 mg/dL Final   Creatinine, Urine  Date Value Ref Range Status  12/27/2012 76.76 mg/dL Final          Failed - K in normal range and within 360 days    Potassium  Date Value Ref Range Status  01/08/2019 4.5 3.5 - 5.2 mmol/L Final          Failed - Na in normal range and within 360 days    Sodium  Date Value Ref Range Status  01/08/2019 140 134 - 144 mmol/L Final          Failed - Last BP in normal range    BP Readings from Last 1 Encounters:  10/08/19 (!) 160/94          Passed - Valid encounter within last 6 months    Recent Outpatient Visits           4 months ago Hyperglycemia   [facility], [place], [place]   1 year ago Essential hypertension   [place], Vernia Buff, NP

## 2020-02-24 ENCOUNTER — Other Ambulatory Visit: Payer: Self-pay

## 2020-02-24 ENCOUNTER — Ambulatory Visit (HOSPITAL_COMMUNITY)
Admission: EM | Admit: 2020-02-24 | Discharge: 2020-02-24 | Disposition: A | Discharge status: Home / Self Care | Payer: Medicaid Other | Attending: Family Medicine | Admitting: Family Medicine

## 2020-02-24 ENCOUNTER — Encounter (HOSPITAL_COMMUNITY): Payer: Self-pay

## 2020-02-24 DIAGNOSIS — K0889 Other specified disorders of teeth and supporting structures: Secondary | ICD-10-CM | POA: Diagnosis not present

## 2020-02-24 MED ORDER — HYDROCODONE-ACETAMINOPHEN 5-325 MG PO TABS: 1.0000 | ORAL_TABLET | Freq: Four times a day (QID) | ORAL | 0 refills | Status: DC | PRN

## 2020-02-24 MED ORDER — CLINDAMYCIN HCL 300 MG PO CAPS: 300.0000 mg | ORAL_CAPSULE | Freq: Three times a day (TID) | ORAL | 0 refills | Status: DC

## 2020-02-24 NOTE — Discharge Instructions (Addendum)

## 2020-02-24 NOTE — ED Triage Notes (Signed)
Pt presents with dental pain x 3 days. Pt states the pain is localized on the left side.

## 2020-02-27 NOTE — ED Provider Notes (Signed)
[place]   [id number] 02/24/20 Arrival Time: 7425  ASSESSMENT & PLAN:  1. Pain, dental     No sign of abscess requiring I&D at this time. Discussed.  Meds ordered this encounter  Medications  . clindamycin (CLEOCIN) 300 MG capsule    Sig: Take 1 capsule (300 mg total) by mouth 3 (three) times daily.    Dispense:  30 capsule    Refill:  0  . HYDROcodone-acetaminophen (NORCO/VICODIN) 5-325 MG tablet    Sig: Take 1 tablet by mouth every 6 (six) hours as needed for moderate pain or severe pain.    Dispense:  8 tablet    Refill:  0    [place] Controlled Substances Registry consulted for this patient. I feel the risk/benefit ratio today is favorable for proceeding with this prescription for a controlled substance. Medication sedation precautions given.  Dental resource written instructions given. She will schedule dental evaluation as soon as possible if not improving over the next 24-48 hours.  Reviewed expectations re: course of current medical issues. Questions answered. Outlined signs and symptoms indicating need for more acute intervention. Patient verbalized understanding. After Visit Summary given.   SUBJECTIVE:  Catherine [name] is a 44 y.o. female who reports gradual onset of left upper dental pain described as aching/throbbing. Present for 3 days. Fever: absent. Tolerating PO intake but reports pain with chewing. Normal swallowing. She does not see a dentist regularly. No neck swelling or pain. OTC analgesics without relief.   OBJECTIVE: Vitals:   02/24/20 1656  BP: (!) 152/104  Pulse: 74  Resp: 18  Temp: 98.4 F (36.9 C)  TempSrc: Oral  SpO2: 98%    General appearance: alert; no distress HENT: normocephalic; atraumatic; dentition: fair; left upper gums without areas of fluctuance, drainage, or bleeding and with tenderness to palpation; normal jaw movement without difficulty Neck: supple without LAD; FROM; trachea midline Lungs: normal respirations;  unlabored; speaks full sentences without difficulty Skin: warm and dry Psychological: alert and cooperative; normal mood and affect  No Known Allergies  Past Medical History:  Diagnosis Date  . Advanced maternal age (AMA) in pregnancy   . GERD (gastroesophageal reflux disease) 11/03/2012  . Gestational diabetes    diet controlled  . Gestational hypertension   . Late prenatal care   . Medical history non-contributory   . Preeclampsia    Social History   Socioeconomic History  . Marital status: Single    Spouse name: Not on file  . Number of children: 1  . Years of education: Not on file  . Highest education level: Not on file  Occupational History  . Not on file  Tobacco Use  . Smoking status: Never Smoker  . Smokeless tobacco: Never Used  Substance and Sexual Activity  . Alcohol use: No  . Drug use: No  . Sexual activity: Not Currently  Other Topics Concern  . Not on file  Social History Narrative  . Not on file   Social Determinants of Health   Financial Resource Strain: Not on file  Food Insecurity: Not on file  Transportation Needs: Not on file  Physical Activity: Not on file  Stress: Not on file  Social Connections: Not on file  Intimate Partner Violence: Not on file   Family History  Problem Relation Age of Onset  . Alcohol abuse Neg Hx   . Arthritis Neg Hx   . Asthma Neg Hx   . Birth defects Neg Hx   . Cancer Neg Hx   .  COPD Neg Hx   . Depression Neg Hx   . Diabetes Neg Hx   . Drug abuse Neg Hx   . Early death Neg Hx   . Hearing loss Neg Hx   . Heart disease Neg Hx   . Hyperlipidemia Neg Hx   . Hypertension Neg Hx   . Kidney disease Neg Hx   . Learning disabilities Neg Hx   . Mental illness Neg Hx   . Mental retardation Neg Hx   . Miscarriages / Stillbirths Neg Hx   . Stroke Neg Hx   . Vision loss Neg Hx    Past Surgical History:  Procedure Laterality Date  . CESAREAN SECTION N/A 12/21/2012   Procedure: CESAREAN SECTION;  Surgeon:  Florian Buff, MD;  Location: [facility];  Service: Obstetrics;  Laterality: N/A;  . LAPAROSCOPIC OVARIAN  2004     Vanessa Kick, MD 02/27/20 [phone]

## 2020-03-22 ENCOUNTER — Other Ambulatory Visit: Payer: Self-pay | Admitting: Physician Assistant

## 2020-03-22 DIAGNOSIS — I10 Essential (primary) hypertension: Secondary | ICD-10-CM

## 2020-03-22 NOTE — Telephone Encounter (Signed)
Requested medication (s) are due for refill today:   Yes  Requested medication (s) are on the active medication list:   Yes  Future visit scheduled:   No   Last ordered: 02/23/2020 #30, 0 refills  Failed protocol because labs are due.   Requested Prescriptions  Pending Prescriptions Disp Refills   hydrochlorothiazide (HYDRODIURIL) 25 MG tablet [Pharmacy Med Name: HYDROCHLOROTHIAZIDE 25MG  TABLETS] 30 tablet 0    Sig: TAKE 1 TABLET(25 MG) BY MOUTH DAILY FOR 14 DAYS      Cardiovascular: Diuretics - Thiazide Failed - 03/22/2020  9:16 AM      Failed - Ca in normal range and within 360 days    Calcium  Date Value Ref Range Status  01/08/2019 9.4 8.7 - 10.2 mg/dL Final          Failed - Cr in normal range and within 360 days    Creat  Date Value Ref Range Status  12/11/2012 0.59 0.50 - 1.10 mg/dL Final   Creatinine, Ser  Date Value Ref Range Status  01/08/2019 0.99 0.57 - 1.00 mg/dL Final   Creatinine, Urine  Date Value Ref Range Status  12/27/2012 76.76 mg/dL Final          Failed - K in normal range and within 360 days    Potassium  Date Value Ref Range Status  01/08/2019 4.5 3.5 - 5.2 mmol/L Final          Failed - Na in normal range and within 360 days    Sodium  Date Value Ref Range Status  01/08/2019 140 134 - 144 mmol/L Final          Failed - Last BP in normal range    BP Readings from Last 1 Encounters:  02/24/20 (!) 152/104          Passed - Valid encounter within last 6 months    Recent Outpatient Visits           5 months ago Hyperglycemia   [facility], [place], [place]   1 year ago Essential hypertension   [place], Vernia Buff, NP

## 2020-05-04 ENCOUNTER — Other Ambulatory Visit: Payer: Self-pay | Admitting: Family Medicine

## 2020-05-04 DIAGNOSIS — I10 Essential (primary) hypertension: Secondary | ICD-10-CM

## 2020-05-11 ENCOUNTER — Ambulatory Visit: Payer: Medicaid Other | Admitting: Nurse Practitioner

## 2020-07-07 ENCOUNTER — Encounter: Payer: Medicaid Other | Admitting: Medical

## 2020-07-08 ENCOUNTER — Ambulatory Visit: Payer: Medicaid Other | Attending: Nurse Practitioner | Admitting: Nurse Practitioner

## 2020-07-08 ENCOUNTER — Other Ambulatory Visit: Payer: Self-pay

## 2020-07-14 ENCOUNTER — Telehealth: Payer: Self-pay

## 2020-07-14 NOTE — Telephone Encounter (Signed)
Returned pt call... Pt states she is needing her hctz refilled. Made pt aware that I am not able to refill it because the rx back in March was her curtesy refill... She will need to schedule an appt with provider in regards to getting it refilled...  Pt also asked bout Klonopin refilled. Made pt aware that we do not prescribed Klonopin. Pt then asked about Xanax and made pt aware that we don't prescribe that either.   Made pt aware that I will send this message to the provider and to the front desk so they can contact her to schedule a sooner aapt

## 2020-07-14 NOTE — Telephone Encounter (Signed)
Copied from [place] [id number]. Topic: General - Other >> Jul 13, 2020 11:35 AM Keene Breath wrote: Reason for CRM: Patient would like the nurse to call patient regarding her anxiety.  Patient does have an appt. In July but would like to discuss further with the nurse.  CB# [phone]

## 2020-07-17 ENCOUNTER — Other Ambulatory Visit: Payer: Self-pay | Admitting: Nurse Practitioner

## 2020-07-18 ENCOUNTER — Encounter: Payer: Self-pay | Admitting: Nurse Practitioner

## 2020-07-18 ENCOUNTER — Ambulatory Visit (INDEPENDENT_AMBULATORY_CARE_PROVIDER_SITE_OTHER): Payer: Medicaid Other | Admitting: Nurse Practitioner

## 2020-07-18 ENCOUNTER — Other Ambulatory Visit: Payer: Self-pay

## 2020-07-18 ENCOUNTER — Other Ambulatory Visit (HOSPITAL_COMMUNITY)
Admission: RE | Admit: 2020-07-18 | Discharge: 2020-07-18 | Disposition: A | Discharge status: Home / Self Care | Payer: Medicaid Other | Source: Ambulatory Visit | Attending: Nurse Practitioner | Admitting: Nurse Practitioner

## 2020-07-18 ENCOUNTER — Other Ambulatory Visit: Payer: Self-pay | Admitting: Nurse Practitioner

## 2020-07-18 VITALS — BP 150/103 | HR 101 | Wt 151.0 lb

## 2020-07-18 DIAGNOSIS — F17200 Nicotine dependence, unspecified, uncomplicated: Secondary | ICD-10-CM

## 2020-07-18 DIAGNOSIS — Z01419 Encounter for gynecological examination (general) (routine) without abnormal findings: Secondary | ICD-10-CM | POA: Diagnosis present

## 2020-07-18 DIAGNOSIS — F4310 Post-traumatic stress disorder, unspecified: Secondary | ICD-10-CM

## 2020-07-18 DIAGNOSIS — I10 Essential (primary) hypertension: Secondary | ICD-10-CM | POA: Diagnosis not present

## 2020-07-18 MED ORDER — HYDROCHLOROTHIAZIDE 25 MG PO TABS: 25.0000 mg | ORAL_TABLET | Freq: Every day | ORAL | 0 refills | Status: DC

## 2020-07-18 NOTE — Patient Instructions (Signed)
Managing the Challenge of Quitting Smoking Quitting smoking is a physical and mental challenge. You will face cravings, withdrawal symptoms, and temptation. Before quitting, work with your health care provider to make a plan that can help you manage quitting. Preparation can help you quit and keep you from giving in. How to manage lifestyle changes Managing stress Stress can make you want to smoke, and wanting to smoke may cause stress. It is important to find ways to manage your stress. You might try some of the following:  Practice relaxation techniques. ? Breathe slowly and deeply, in through your nose and out through your mouth. ? Listen to music. ? Soak in a bath or take a shower. ? Imagine a peaceful place or vacation.  Get some support. ? Talk with family or friends about your stress. ? Join a support group. ? Talk with a counselor or therapist.  Get some physical activity. ? Go for a walk, run, or bike ride. ? Play a favorite sport. ? Practice yoga.   Medicines Talk with your health care provider about medicines that might help you deal with cravings and make quitting easier for you. Relationships Social situations can be difficult when you are quitting smoking. To manage this, you can:  Avoid parties and other social situations where people might be smoking.  Avoid alcohol.  Leave right away if you have the urge to smoke.  Explain to your family and friends that you are quitting smoking. Ask for support and let them know you might be a bit grumpy.  Plan activities where smoking is not an option. General instructions Be aware that many people gain weight after they quit smoking. However, not everyone does. To keep from gaining weight, have a plan in place before you quit and stick to the plan after you quit. Your plan should include:  Having healthy snacks. When you have a craving, it may help to: ? Eat popcorn, carrots, celery, or other cut vegetables. ? Chew  sugar-free gum.  Changing how you eat. ? Eat small portion sizes at meals. ? Eat 4-6 small meals throughout the day instead of 1-2 large meals a day. ? Be mindful when you eat. Do not watch television or do other things that might distract you as you eat.  Exercising regularly. ? Make time to exercise each day. If you do not have time for a long workout, do short bouts of exercise for 5-10 minutes several times a day. ? Do some form of strengthening exercise, such as weight lifting. ? Do some exercise that gets your heart beating and causes you to breathe deeply, such as walking fast, running, swimming, or biking. This is very important.  Drinking plenty of water or other low-calorie or no-calorie drinks. Drink 6-8 glasses of water daily.   How to recognize withdrawal symptoms Your body and mind may experience discomfort as you try to get used to not having nicotine in your system. These effects are called withdrawal symptoms. They may include:  Feeling hungrier than normal.  Having trouble concentrating.  Feeling irritable or restless.  Having trouble sleeping.  Feeling depressed.  Craving a cigarette. To manage withdrawal symptoms:  Avoid places, people, and activities that trigger your cravings.  Remember why you want to quit.  Get plenty of sleep.  Avoid coffee and other caffeinated drinks. These may worsen some of your symptoms. These symptoms may surprise you. But be assured that they are normal to have when quitting smoking. How to manage cravings   Come up with a plan for how to deal with your cravings. The plan should include the following:  A definition of the specific situation you want to deal with.  An alternative action you will take.  A clear idea for how this action will help.  The name of someone who might help you with this. Cravings usually last for 5-10 minutes. Consider taking the following actions to help you with your plan to deal with  cravings:  Keep your mouth busy. ? Chew sugar-free gum. ? Suck on hard candies or a straw. ? Brush your teeth.  Keep your hands and body busy. ? Change to a different activity right away. ? Squeeze or play with a ball. ? Do an activity or a hobby, such as making bead jewelry, practicing needlepoint, or working with wood. ? Mix up your normal routine. ? Take a short exercise break. Go for a quick walk or run up and down stairs.  Focus on doing something kind or helpful for someone else.  Call a friend or family member to talk during a craving.  Join a support group.  Contact a quitline. Where to find support To get help or find a support group:  Call the [facility]'s Smoking Quitline: 1-800-QUIT NOW (951)450-3154)  Visit the website of the Substance Abuse and [place]: ktimeonline.com  Text QUIT to SmokefreeTXT: 194174 Where to find more information Visit these websites to find more information on quitting smoking:  [place]: www.smokefree.gov  American Lung Association: www.lung.org  American Cancer Society: www.cancer.org  Centers for Disease Control and Prevention: http://www.wolf.info/  American Heart Association: www.heart.org Contact a health care provider if:  You want to change your plan for quitting.  The medicines you are taking are not helping.  Your eating feels out of control or you cannot sleep. Get help right away if:  You feel depressed or become very anxious. Summary  Quitting smoking is a physical and mental challenge. You will face cravings, withdrawal symptoms, and temptation to smoke again. Preparation can help you as you go through these challenges.  Try different techniques to manage stress, handle social situations, and prevent weight gain.  You can deal with cravings by keeping your mouth busy (such as by chewing gum), keeping your hands and body busy, calling family or friends, or  contacting a quitline for people who want to quit smoking.  You can deal with withdrawal symptoms by avoiding places where people smoke, getting plenty of rest, and avoiding drinks with caffeine. This information is not intended to replace advice given to you by your health care provider. Make sure you discuss any questions you have with your health care provider. Document Revised: 12/16/2018 Document Reviewed: 12/16/2018 Elsevier Patient Education  [place], Adult After being diagnosed with an anxiety disorder, you may be relieved to know why you have felt or behaved a certain way. You may also feel overwhelmed about the treatment ahead and what it will mean for your life. With care and support, you can manage this condition and recover from it. How to manage lifestyle changes Managing stress and anxiety Stress is your body's reaction to life changes and events, both good and bad. Most stress will last just a few hours, but stress can be ongoing and can lead to more than just stress. Although stress can play a major role in anxiety, it is not the same as anxiety. Stress is usually caused by something external,  such as a deadline, test, or competition. Stress normally passes after the triggering event has ended.  Anxiety is caused by something internal, such as imagining a terrible outcome or worrying that something will go wrong that will devastate you. Anxiety often does not go away even after the triggering event is over, and it can become long-term (chronic) worry. It is important to understand the differences between stress and anxiety and to manage your stress effectively so that it does not lead to an anxious response. Talk with your health care provider or a counselor to learn more about reducing anxiety and stress. He or she may suggest tension reduction techniques, such as:  Music therapy. This can include creating or listening to music that you enjoy and that  inspires you.  Mindfulness-based meditation. This involves being aware of your normal breaths while not trying to control your breathing. It can be done while sitting or walking.  Centering prayer. This involves focusing on a word, phrase, or sacred image that means something to you and brings you peace.  Deep breathing. To do this, expand your stomach and inhale slowly through your nose. Hold your breath for 3-5 seconds. Then exhale slowly, letting your stomach muscles relax.  Self-talk. This involves identifying thought patterns that lead to anxiety reactions and changing those patterns.  Muscle relaxation. This involves tensing muscles and then relaxing them. Choose a tension reduction technique that suits your lifestyle and personality. These techniques take time and practice. Set aside 5-15 minutes a day to do them. Therapists can offer counseling and training in these techniques. The training to help with anxiety may be covered by some insurance plans. Other things you can do to manage stress and anxiety include:  Keeping a stress/anxiety diary. This can help you learn what triggers your reaction and then learn ways to manage your response.  Thinking about how you react to certain situations. You may not be able to control everything, but you can control your response.  Making time for activities that help you relax and not feeling guilty about spending your time in this way.  Visual imagery and yoga can help you stay calm and relax.   Medicines Medicines can help ease symptoms. Medicines for anxiety include:  Anti-anxiety drugs.  Antidepressants. Medicines are often used as a primary treatment for anxiety disorder. Medicines will be prescribed by a health care provider. When used together, medicines, psychotherapy, and tension reduction techniques may be the most effective treatment. Relationships Relationships can play a big part in helping you recover. Try to spend more time  connecting with trusted friends and family members. Consider going to couples counseling, taking family education classes, or going to family therapy. Therapy can help you and others better understand your condition. How to recognize changes in your anxiety Everyone responds differently to treatment for anxiety. Recovery from anxiety happens when symptoms decrease and stop interfering with your daily activities at home or work. This may mean that you will start to:  Have better concentration and focus. Worry will interfere less in your daily thinking.  Sleep better.  Be less irritable.  Have more energy.  Have improved memory. It is important to recognize when your condition is getting worse. Contact your health care provider if your symptoms interfere with home or work and you feel like your condition is not improving. Follow these instructions at home: Activity  Exercise. Most adults should do the following: ? Exercise for at least 150 minutes each week. The exercise  should increase your heart rate and make you sweat (moderate-intensity exercise). ? Strengthening exercises at least twice a week.  Get the right amount and quality of sleep. Most adults need 7-9 hours of sleep each night. Lifestyle  Eat a healthy diet that includes plenty of vegetables, fruits, whole grains, low-fat dairy products, and lean protein. Do not eat a lot of foods that are high in solid fats, added sugars, or salt.  Make choices that simplify your life.  Do not use any products that contain nicotine or tobacco, such as cigarettes, e-cigarettes, and chewing tobacco. If you need help quitting, ask your health care provider.  Avoid caffeine, alcohol, and certain over-the-counter cold medicines. These may make you feel worse. Ask your pharmacist which medicines to avoid.   General instructions  Take over-the-counter and prescription medicines only as told by your health care provider.  Keep all follow-up  visits as told by your health care provider. This is important. Where to find support You can get help and support from these sources:  Self-help groups.  Online and [employer].  A trusted spiritual leader.  Couples counseling.  Family education classes.  Family therapy. Where to find more information You may find that joining a support group helps you deal with your anxiety. The following sources can help you locate counselors or support groups near you:  [place]: www.mentalhealthamerica.net  Anxiety and Depression Association of [place] (ADAA): https://www.clark.net/  National Alliance on Mental Illness (NAMI): www.nami.org Contact a health care provider if you:  Have a hard time staying focused or finishing daily tasks.  Spend many hours a day feeling worried about everyday life.  Become exhausted by worry.  Start to have headaches, feel tense, or have nausea.  Urinate more than normal.  Have diarrhea. Get help right away if you have:  A racing heart and shortness of breath.  Thoughts of hurting yourself or others. If you ever feel like you may hurt yourself or others, or have thoughts about taking your own life, get help right away. You can go to your nearest emergency department or call:  Your local emergency services (911 in the [place]  A suicide crisis helpline, such as the [place] at 406 729 4076. This is open 24 hours a day. Summary  Taking steps to learn and use tension reduction techniques can help calm you and help prevent triggering an anxiety reaction.  When used together, medicines, psychotherapy, and tension reduction techniques may be the most effective treatment.  Family, friends, and partners can play a big part in helping you recover from an anxiety disorder. This information is not intended to replace advice given to you by your health care provider. Make sure you discuss any questions you have  with your health care provider. Document Revised: 07/29/2018 Document Reviewed: 07/29/2018 Elsevier Patient Education  [place].
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
## 2020-07-20 LAB — CYTOLOGY - PAP
Comment: NEGATIVE
Diagnosis: NEGATIVE
High risk HPV: NEGATIVE

## 2020-07-22 NOTE — Telephone Encounter (Signed)
Called Patient to try to get her scheduled sooner but Patient did not answer and unable to leave voicemail.

## 2020-07-25 ENCOUNTER — Telehealth: Payer: Self-pay | Admitting: Nurse Practitioner

## 2020-07-25 NOTE — Telephone Encounter (Signed)
Patient called to ask the nurse or doctor to call her regarding her BP medication.  She stated that this medication is not keeping her BP low.  She wanted to know if she should be taking something different.  Please advise and call patient to discuss at [phone]

## 2020-07-25 NOTE — Telephone Encounter (Signed)
I have not seen her since 2020. She was recently started on HCTZ by another doctor. She can be seen at mobile clinic or urgent care if blood pressures are running high. She has appt scheduled with me in July.

## 2020-07-25 NOTE — Telephone Encounter (Signed)
Will forward to provider  

## 2020-07-26 NOTE — Telephone Encounter (Signed)
Contacted pt to go over provider response pt didn't answer phone kept ringing and went to busy signal

## 2020-09-14 ENCOUNTER — Ambulatory Visit: Payer: Medicaid Other | Attending: Nurse Practitioner | Admitting: Nurse Practitioner

## 2020-09-14 ENCOUNTER — Other Ambulatory Visit: Payer: Self-pay

## 2020-09-14 VITALS — BP 150/96 | HR 70 | Resp 16 | Wt 149.4 lb

## 2020-09-14 DIAGNOSIS — Z733 Stress, not elsewhere classified: Secondary | ICD-10-CM | POA: Diagnosis not present

## 2020-09-14 DIAGNOSIS — E785 Hyperlipidemia, unspecified: Secondary | ICD-10-CM

## 2020-09-14 DIAGNOSIS — K219 Gastro-esophageal reflux disease without esophagitis: Secondary | ICD-10-CM | POA: Insufficient documentation

## 2020-09-14 DIAGNOSIS — Z658 Other specified problems related to psychosocial circumstances: Secondary | ICD-10-CM | POA: Insufficient documentation

## 2020-09-14 DIAGNOSIS — D649 Anemia, unspecified: Secondary | ICD-10-CM

## 2020-09-14 DIAGNOSIS — Z79899 Other long term (current) drug therapy: Secondary | ICD-10-CM | POA: Insufficient documentation

## 2020-09-14 DIAGNOSIS — F331 Major depressive disorder, recurrent, moderate: Secondary | ICD-10-CM

## 2020-09-14 DIAGNOSIS — F4310 Post-traumatic stress disorder, unspecified: Secondary | ICD-10-CM

## 2020-09-14 DIAGNOSIS — Z1159 Encounter for screening for other viral diseases: Secondary | ICD-10-CM

## 2020-09-14 DIAGNOSIS — I10 Essential (primary) hypertension: Secondary | ICD-10-CM | POA: Diagnosis not present

## 2020-09-14 MED ORDER — AMLODIPINE BESYLATE 10 MG PO TABS: 10.0000 mg | ORAL_TABLET | Freq: Every day | ORAL | 1 refills | Status: DC

## 2020-09-14 MED ORDER — HYDROCHLOROTHIAZIDE 25 MG PO TABS: 25.0000 mg | ORAL_TABLET | Freq: Every day | ORAL | 1 refills | Status: DC

## 2020-09-14 NOTE — Progress Notes (Signed)
Assessment & Plan:  Catherine [name] was seen today for hypertension and anxiety.  Diagnoses and all orders for this visit:  Essential hypertension -     amLODipine (NORVASC) 10 MG tablet; Take 1 tablet (10 mg total) by mouth daily. -     hydrochlorothiazide (HYDRODIURIL) 25 MG tablet; Take 1 tablet (25 mg total) by mouth daily. -     CMP14+EGFR Continue all antihypertensives as prescribed.  Remember to bring in your blood pressure log with you for your follow up appointment.  DASH/Mediterranean Diets are healthier choices for HTN.    PTSD (post-traumatic stress disorder) -     Ambulatory referral to Psychiatry  Moderate episode of recurrent major depressive disorder ([place]) -     Ambulatory referral to Psychiatry  Need for hepatitis C screening test -     HCV Ab w Reflex to Quant PCR -     Interpretation:  Dyslipidemia, goal LDL below 100 -     Lipid panel  Anemia, unspecified type -     CBC   Patient has been counseled on age-appropriate routine health concerns for screening and prevention. These are reviewed and up-to-date. Referrals have been placed accordingly. Immunizations are up-to-date or declined.    Subjective:   Chief Complaint  Patient presents with   Hypertension   Anxiety    HPI Catherine [name] 45 y.o. female presents to office today for follow up to HTN She has a past medical history of  GERD(11/03/2012), Gestational diabetes, Gestational hypertension   Essential Hypertension Poorly controlled with HCTZ 25 mg daily. Will add amlodipine 10 mg today. Denies chest pain, shortness of breath, palpitations, lightheadedness, dizziness, headaches or BLE edema.   BP Readings from Last 3 Encounters:  09/14/20 (!) 150/96  07/18/20 (!) 150/103  02/24/20 (!) 152/104    She has a history of PTSD, anxiety and depression. Requesting referral to psychiatry today. Denies any current thoughts of self harm. She is currently taking minipress 5 mg daily, latuda 60 mg daily,  Remeron 45 mg daily and klonopin 0.5 mg prn. She does not feel her current therapist is providing adequate counseling.  Depression screen [facility] 2/9 09/14/2020 01/05/2019  Decreased Interest 2 1  Down, Depressed, Hopeless 2 0  PHQ - 2 Score 4 1  Altered sleeping 2 1  Tired, decreased energy 2 0  Change in appetite 2 0  Feeling bad or failure about yourself  2 1  Trouble concentrating 2 0  Moving slowly or fidgety/restless 2 0  Suicidal thoughts 2 0  PHQ-9 Score 18 3    GAD 7 : Generalized Anxiety Score 09/14/2020 01/05/2019  Nervous, Anxious, on Edge 3 1  Control/stop worrying 3 1  Worry too much - different things 3 1  Trouble relaxing 3 1  Restless 1 0  Easily annoyed or irritable 1 0  Afraid - awful might happen 3 0  Total GAD 7 Score 17 4      Review of Systems  Constitutional:  Negative for fever, malaise/fatigue and weight loss.  HENT: Negative.  Negative for nosebleeds.   Eyes: Negative.  Negative for blurred vision, double vision and photophobia.  Respiratory: Negative.  Negative for cough and shortness of breath.   Cardiovascular: Negative.  Negative for chest pain, palpitations and leg swelling.  Gastrointestinal: Negative.  Negative for heartburn, nausea and vomiting.  Musculoskeletal: Negative.  Negative for myalgias.  Neurological: Negative.  Negative for dizziness, focal weakness, seizures and headaches.  Psychiatric/Behavioral:  Positive for depression. Negative  for suicidal ideas. The patient is nervous/anxious.    Past Medical History:  Diagnosis Date   Advanced maternal age (AMA) in pregnancy    GERD (gastroesophageal reflux disease) 11/03/2012   Gestational diabetes    diet controlled   Gestational hypertension    Late prenatal care    Medical history non-contributory    Preeclampsia     Past Surgical History:  Procedure Laterality Date   CESAREAN SECTION N/A 12/21/2012   Procedure: CESAREAN SECTION;  Surgeon: Florian Buff, MD;  Location: [facility];   Service: Obstetrics;  Laterality: N/A;   LAPAROSCOPIC OVARIAN  2004    Family History  Problem Relation Age of Onset   Hypertension Father    Alcohol abuse Neg Hx    Arthritis Neg Hx    Asthma Neg Hx    Birth defects Neg Hx    Cancer Neg Hx    COPD Neg Hx    Depression Neg Hx    Diabetes Neg Hx    Drug abuse Neg Hx    Early death Neg Hx    Hearing loss Neg Hx    Heart disease Neg Hx    Hyperlipidemia Neg Hx    Kidney disease Neg Hx    Learning disabilities Neg Hx    Mental illness Neg Hx    Mental retardation Neg Hx    Miscarriages / Stillbirths Neg Hx    Stroke Neg Hx    Vision loss Neg Hx     Social History Reviewed with no changes to be made today.   Outpatient Medications Prior to Visit  Medication Sig Dispense Refill   clonazePAM (KLONOPIN) 0.5 MG tablet Take 0.5 mg by mouth as needed for anxiety.     FLUoxetine (PROZAC) 40 MG capsule Take 40 mg by mouth daily.     LATUDA 60 MG TABS SMARTSIG:1 Tablet(s) By Mouth Every Evening     mirtazapine (REMERON) 45 MG tablet Take 45 mg by mouth at bedtime.     prazosin (MINIPRESS) 5 MG capsule Take 1 capsule (5 mg total) by mouth at bedtime. 30 capsule 2   hydrochlorothiazide (HYDRODIURIL) 25 MG tablet Take 1 tablet (25 mg total) by mouth daily. 30 tablet 0   No facility-administered medications prior to visit.    No Known Allergies     Objective:    BP (!) 150/96   Pulse 70   Resp 16   Wt 149 lb 6.4 oz (67.8 kg)   SpO2 98%   BMI 26.47 kg/m  Wt Readings from Last 3 Encounters:  09/14/20 149 lb 6.4 oz (67.8 kg)  07/18/20 151 lb (68.5 kg)  10/13/17 150 lb (68 kg)    Physical Exam Vitals and nursing note reviewed.  Constitutional:      Appearance: She is well-developed.  HENT:     Head: Normocephalic and atraumatic.  Cardiovascular:     Rate and Rhythm: Normal rate and regular rhythm.     Heart sounds: Normal heart sounds. No murmur heard.   No friction rub. No gallop.  Pulmonary:     Effort: Pulmonary  effort is normal. No tachypnea or respiratory distress.     Breath sounds: Normal breath sounds. No decreased breath sounds, wheezing, rhonchi or rales.  Chest:     Chest wall: No tenderness.  Abdominal:     General: Bowel sounds are normal.     Palpations: Abdomen is soft.  Musculoskeletal:        General: Normal range of  motion.     Cervical back: Normal range of motion.  Skin:    General: Skin is warm and dry.  Neurological:     Mental Status: She is alert and oriented to person, place, and time.     Coordination: Coordination normal.  Psychiatric:        Attention and Perception: Attention normal.        Mood and Affect: Mood normal.        Speech: Speech normal.        Behavior: Behavior normal. Behavior is cooperative.        Thought Content: Thought content normal.        Cognition and Memory: Cognition normal.        Judgment: Judgment normal.         Patient has been counseled extensively about nutrition and exercise as well as the importance of adherence with medications and regular follow-up. The patient was given clear instructions to go to ER or return to medical center if symptoms don't improve, worsen or new problems develop. The patient verbalized understanding.   Follow-up: Return in about 4 weeks (around 10/12/2020) for Follow up with [facility] BP check. See me in months.   Gildardo Pounds, FNP-BC [facility] [place], [place]   09/17/2020, 12:46 PM

## 2020-09-15 LAB — LIPID PANEL
Chol/HDL Ratio: 2.6 ratio (ref 0.0–4.4)
Cholesterol, Total: 162 mg/dL (ref 100–199)
HDL: 62 mg/dL (ref >39)
LDL Chol Calc (NIH): 85 mg/dL (ref 0–99)
Triglycerides: 81 mg/dL (ref 0–149)
VLDL Cholesterol Cal: 15 mg/dL (ref 5–40)

## 2020-09-15 LAB — CMP14+EGFR
ALT: 12 IU/L (ref 0–32)
AST: 14 IU/L (ref 0–40)
Albumin/Globulin Ratio: 2.1 (ref 1.2–2.2)
Albumin: 4.6 g/dL (ref 3.8–4.8)
Alkaline Phosphatase: 79 IU/L (ref 44–121)
BUN/Creatinine Ratio: 11 (ref 9–23)
BUN: 9 mg/dL (ref 6–24)
Bilirubin Total: 0.2 mg/dL (ref 0.0–1.2)
CO2: 25 mmol/L (ref 20–29)
Calcium: 10 mg/dL (ref 8.7–10.2)
Chloride: 102 mmol/L (ref 96–106)
Creatinine, Ser: 0.79 mg/dL (ref 0.57–1.00)
Globulin, Total: 2.2 g/dL (ref 1.5–4.5)
Glucose: 78 mg/dL (ref 65–99)
Potassium: 4.1 mmol/L (ref 3.5–5.2)
Sodium: 141 mmol/L (ref 134–144)
Total Protein: 6.8 g/dL (ref 6.0–8.5)
eGFR: 96 mL/min/{1.73_m2} (ref >59)

## 2020-09-15 LAB — HCV INTERPRETATION

## 2020-09-15 LAB — HCV AB W REFLEX TO QUANT PCR: HCV Ab: <0.1 s/co ratio (ref 0.0–0.9)

## 2020-09-15 LAB — CBC
Hematocrit: 37.1 % (ref 34.0–46.6)
Hemoglobin: 12.1 g/dL (ref 11.1–15.9)
MCH: 29.3 pg (ref 26.6–33.0)
MCHC: 32.6 g/dL (ref 31.5–35.7)
MCV: 90 fL (ref 79–97)
Platelets: 255 10*3/uL (ref 150–450)
RBC: 4.13 x10E6/uL (ref 3.77–5.28)
RDW: 14.3 % (ref 11.7–15.4)
WBC: 7.8 10*3/uL (ref 3.4–10.8)

## 2020-09-17 ENCOUNTER — Encounter: Payer: Self-pay | Admitting: Nurse Practitioner

## 2020-10-14 ENCOUNTER — Other Ambulatory Visit: Payer: Self-pay

## 2020-10-14 ENCOUNTER — Encounter: Payer: Self-pay | Admitting: Pharmacist

## 2020-10-14 ENCOUNTER — Ambulatory Visit: Payer: Medicaid Other | Attending: Nurse Practitioner | Admitting: Pharmacist

## 2020-10-14 VITALS — BP 102/65

## 2020-10-14 DIAGNOSIS — I10 Essential (primary) hypertension: Secondary | ICD-10-CM

## 2020-10-14 NOTE — Progress Notes (Signed)
   S:    PCP: Zelda   Patient arrives in good spirits. Presents to the clinic for hypertension evaluation, counseling, and management. Patient was referred and last seen by Primary Care Provider on 09/14/2020. BP was 150/96 mmHg at that visit. Amlodipine was added to pt's regimen.   Today, pt reports feeling well. Denies chest pain/dyspnea/HA/blurred vision.   Medication adherence reported. She has taken both medications this morning.   Current BP Medications include:  amlodipine 10 mg daily, HCTZ 25 mg daily   Dietary habits include: compliant with salt restriction. Does drink coffee daily but limits herself to drinking it in the morning.  Exercise habits include:walks ~5 miles daily. This usually takes her ~30 minutes.  Family / Social history:  -Fhx: HTN -Tobacco: current every day smoker -Alcohol: none   O:  Vitals:   10/14/20 0854  BP: 102/65   Home BP readings: none  Last 3 Office BP readings: BP Readings from Last 3 Encounters:  10/14/20 102/65  09/14/20 (!) 150/96  07/18/20 (!) 150/103    BMET    Component Value Date/Time   NA 141 09/14/2020 1618   K 4.1 09/14/2020 1618   CL 102 09/14/2020 1618   CO2 25 09/14/2020 1618   GLUCOSE 78 09/14/2020 1618   GLUCOSE 142 (H) 10/13/2017 2157   BUN 9 09/14/2020 1618   CREATININE 0.79 09/14/2020 1618   CREATININE 0.59 12/11/2012 1600   CALCIUM 10.0 09/14/2020 1618   GFRNONAA 71 01/08/2019 0951   GFRAA 82 01/08/2019 0951    Renal function: CrCl cannot be calculated (Patient's most recent lab result is older than the maximum 21 days allowed.).  Clinical ASCVD: No  The 10-year ASCVD risk score Mikey Bussing DC Jr., et al., 2013) is: 0.7%   Values used to calculate the score:     Age: 55 years     Sex: Female     Is Non-Hispanic African American: Yes     Diabetic: No     Tobacco smoker: Yes     Systolic Blood Pressure: [id number] mmHg     Is BP treated: Yes     HDL Cholesterol: 62 mg/dL     Total Cholesterol: 162  mg/dL    A/P: Hypertension longstanding currently at goal on current medications. BP Goal = < 130/80 mmHg. Medication adherence reported. I recommend for her to come back in 1 month for recheck. Her BP is at goal, close to low end of normal with amlodipine 10 mg + HCTZ 25 mg. We could consider decreasing dose of amlodipine '10mg'$  to '5mg'$  daily in the future.  -Continued current regimen for now.    -Counseled on lifestyle modifications for blood pressure control including reduced dietary sodium, increased exercise, adequate sleep.  Results reviewed and written information provided.   Total time in face-to-face counseling 15 minutes.   F/U Clinic Visit in 1 month.   Benard Halsted, PharmD, [place], [place] [phone]

## 2020-11-15 ENCOUNTER — Ambulatory Visit: Payer: Medicaid Other | Attending: Nurse Practitioner | Admitting: Pharmacist

## 2020-11-15 ENCOUNTER — Other Ambulatory Visit: Payer: Self-pay

## 2020-11-15 VITALS — BP 125/80 | HR 78

## 2020-11-15 DIAGNOSIS — I10 Essential (primary) hypertension: Secondary | ICD-10-CM | POA: Diagnosis not present

## 2020-11-15 NOTE — Progress Notes (Signed)
   S:    PCP: Zelda   Patient arrives in good spirits. Presents to the clinic for hypertension evaluation, counseling, and management. Patient was last seen by pharmacy on 10/14/20 BP at that visit was 102/65. Medication regimen was continued and follow up scheduled for 1 month.   Today, pt reports feeling well. Reports improvement in energy and irritability. Denies chest pain/dyspnea/HA/blurred vision/dizziness.  Medication adherence reported. She has taken both medications this morning.   Current BP Medications include:  amlodipine 10 mg daily, HCTZ 25 mg daily   Dietary habits include: compliant with salt restriction and eats a lot of vegetables. Does drink coffee daily but limits herself to drinking it in the morning.  Exercise habits include:walks ~5 miles daily. This usually takes her ~30 minutes.  Family / Social history:  -Fhx: HTN -Tobacco: current every day smoker -Alcohol: none  O:  Vitals:   11/15/20 0921  BP: 125/80  Pulse: 78   Home BP readings: none  Last 3 Office BP readings: BP Readings from Last 3 Encounters:  11/15/20 125/80  10/14/20 102/65  09/14/20 (!) 150/96   BMET    Component Value Date/Time   NA 141 09/14/2020 1618   K 4.1 09/14/2020 1618   CL 102 09/14/2020 1618   CO2 25 09/14/2020 1618   GLUCOSE 78 09/14/2020 1618   GLUCOSE 142 (H) 10/13/2017 2157   BUN 9 09/14/2020 1618   CREATININE 0.79 09/14/2020 1618   CREATININE 0.59 12/11/2012 1600   CALCIUM 10.0 09/14/2020 1618   GFRNONAA 71 01/08/2019 0951   GFRAA 82 01/08/2019 0951    Renal function: CrCl cannot be calculated (Patient's most recent lab result is older than the maximum 21 days allowed.).  Clinical ASCVD: No  The 10-year ASCVD risk score Mikey Bussing DC Jr., et al., 2013) is: 2%   Values used to calculate the score:     Age: 45 years     Sex: Female     Is Non-Hispanic African American: Yes     Diabetic: No     Tobacco smoker: Yes     Systolic Blood Pressure: 0000000 mmHg     Is BP  treated: Yes     HDL Cholesterol: 62 mg/dL     Total Cholesterol: 162 mg/dL    A/P: Hypertension longstanding currently at goal on current medications. BP Goal = < 130/80 mmHg. Medication adherence reported.  Her BP is at goal. No changes recommended at this time.  -Continued current regimen.    -Counseled on lifestyle modifications for blood pressure control including reduced dietary sodium, increased exercise, adequate sleep.  Results reviewed and written information provided.   Total time in face-to-face counseling 20 minutes.   F/U PCP 01/2021  Parthenia Ames, [place] [phone]

## 2020-11-16 ENCOUNTER — Encounter: Payer: Self-pay | Admitting: Pharmacist

## 2020-11-22 ENCOUNTER — Ambulatory Visit: Payer: Medicaid Other

## 2020-12-01 ENCOUNTER — Ambulatory Visit (HOSPITAL_COMMUNITY): Payer: Medicaid Other | Admitting: Clinical

## 2020-12-07 ENCOUNTER — Ambulatory Visit (HOSPITAL_COMMUNITY): Payer: Self-pay | Admitting: Physician Assistant

## 2020-12-08 ENCOUNTER — Other Ambulatory Visit: Payer: Self-pay

## 2020-12-08 ENCOUNTER — Ambulatory Visit (INDEPENDENT_AMBULATORY_CARE_PROVIDER_SITE_OTHER): Payer: Medicaid Other | Admitting: Clinical

## 2020-12-08 DIAGNOSIS — F250 Schizoaffective disorder, bipolar type: Secondary | ICD-10-CM

## 2020-12-08 DIAGNOSIS — F4310 Post-traumatic stress disorder, unspecified: Secondary | ICD-10-CM
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
## 2021-01-16 ENCOUNTER — Ambulatory Visit: Payer: Medicaid Other | Admitting: Nurse Practitioner

## 2021-01-24 ENCOUNTER — Ambulatory Visit (HOSPITAL_COMMUNITY): Payer: Medicaid Other | Admitting: Clinical

## 2021-01-25 ENCOUNTER — Other Ambulatory Visit: Payer: Self-pay

## 2021-01-25 ENCOUNTER — Encounter: Payer: Self-pay | Admitting: Physician Assistant

## 2021-01-25 ENCOUNTER — Ambulatory Visit: Payer: Medicaid Other | Attending: Nurse Practitioner | Admitting: Physician Assistant

## 2021-01-25 VITALS — BP 107/67 | HR 67 | Wt 161.0 lb

## 2021-01-25 DIAGNOSIS — I10 Essential (primary) hypertension: Secondary | ICD-10-CM

## 2021-01-25 DIAGNOSIS — Z3490 Encounter for supervision of normal pregnancy, unspecified, unspecified trimester: Secondary | ICD-10-CM

## 2021-01-25 MED ORDER — LABETALOL HCL 100 MG PO TABS: 100.0000 mg | ORAL_TABLET | Freq: Two times a day (BID) | ORAL | 3 refills | Status: DC

## 2021-01-25 NOTE — Patient Instructions (Signed)
Check blood pressure daily and record and bring to next visit 

## 2021-01-25 NOTE — Progress Notes (Signed)
Catherine [name], is a 45 y.o. female  IDP:[id number]  WER:[id number]  DOB - January 05, 1976  Chief Complaint  Patient presents with   Hypertension       Subjective:   Catherine [name] is a 45 y.o. female here today for BP adjustment.  Just confirmed pregnancy yesterday at [facility].  BP has been controlled on amlodipine and HCT.  LMP 11/10/2020.  No problems updated.  ALLERGIES: No Known Allergies  PAST MEDICAL HISTORY: Past Medical History:  Diagnosis Date   Advanced maternal age (AMA) in pregnancy    GERD (gastroesophageal reflux disease) 11/03/2012   Gestational diabetes    diet controlled   Gestational hypertension    Late prenatal care    Medical history non-contributory    Preeclampsia     MEDICATIONS AT HOME: Prior to Admission medications   Medication Sig Start Date End Date Taking? Authorizing Provider  labetalol (NORMODYNE) 100 MG tablet Take 1 tablet (100 mg total) by mouth 2 (two) times daily. 01/25/21  Yes Devlyn Parish M, PA-C  clonazePAM (KLONOPIN) 0.5 MG tablet Take 0.5 mg by mouth as needed for anxiety.    [provider]  FLUoxetine (PROZAC) 40 MG capsule Take 40 mg by mouth daily.    [provider]  LATUDA 60 MG TABS SMARTSIG:1 Tablet(s) By Mouth Every Evening 05/04/20   [provider]  mirtazapine (REMERON) 45 MG tablet Take 45 mg by mouth at bedtime. 05/08/20   [provider]  prazosin (MINIPRESS) 5 MG capsule Take 1 capsule (5 mg total) by mouth at bedtime. 10/15/19   Sully Manzi, Dionne Bucy, PA-C    ROS: Neg HEENT Neg resp Neg cardiac Neg GI Neg GU Neg MS Neg psych Neg neuro  Objective:   Vitals:   01/25/21 1003  BP: 107/67  Pulse: 67  SpO2: 99%  Weight: 161 lb (73 kg)   Exam General appearance : Awake, alert, not in any distress. Speech Clear. Neck: Supple, no JVD. No cervical lymphadenopathy.  Chest: Good air entry bilaterally, CTAB.  No rales/rhonchi/wheezing CVS: S1 S2 regular, no murmurs.   Extremities: B/L Lower Ext shows no edema, both legs are warm to touch Neurology: Awake alert, and oriented X 3, CN II-XII intact, Non focal Skin: No Rash  Data Review No results found for: HGBA1C  Assessment & Plan   1. Essential hypertension Stop amlodipine and HCTZ(discussed with Vinson Moselle Aus[place]) start- Check BP daily and record and bring to next visit.  She says she will get a cuff. - labetalol (NORMODYNE) 100 MG tablet; Take 1 tablet (100 mg total) by mouth 2 (two) times daily.  Dispense: 60 tablet; Refill: 3  2. Pregnancy, unspecified gestational age [facility] 08/17/2021 Take PNV.   Drink adequate water - labetalol (NORMODYNE) 100 MG tablet; Take 1 tablet (100 mg total) by mouth 2 (two) times daily.  Dispense: 60 tablet; Refill: 3  Patient have been counseled extensively about nutrition and exercise. Other issues discussed during this visit include: low cholesterol diet, weight control and daily exercise, foot care, annual eye examinations at Ophthalmology, importance of adherence with medications and regular follow-up. We also discussed long term complications of uncontrolled diabetes and hypertension.   Return for see Lurena Joiner in 3 weeks for BP.  The patient was given clear instructions to go to ER or return to medical center if symptoms don't improve, worsen or new problems develop. The patient verbalized understanding. The patient was told to call to get lab results if they haven't heard anything in  the next week.      Freeman Caldron, PA-C [facility] and [place], [place]   01/25/2021, 10:15 AM Patient ID: Catherine [name], female   DOB: 08-18-1975, 45 y.o.   MRN: [id number]

## 2021-01-26 ENCOUNTER — Ambulatory Visit (HOSPITAL_COMMUNITY): Payer: Medicaid Other | Admitting: Physician Assistant

## 2021-02-01 ENCOUNTER — Ambulatory Visit (INDEPENDENT_AMBULATORY_CARE_PROVIDER_SITE_OTHER): Payer: Medicaid Other

## 2021-02-01 ENCOUNTER — Other Ambulatory Visit: Payer: Self-pay

## 2021-02-01 VITALS — BP 118/70 | HR 80 | Ht 64.5 in | Wt 163.3 lb

## 2021-02-01 DIAGNOSIS — Z3A09 9 weeks gestation of pregnancy: Secondary | ICD-10-CM | POA: Diagnosis not present

## 2021-02-01 DIAGNOSIS — O0990 Supervision of high risk pregnancy, unspecified, unspecified trimester: Secondary | ICD-10-CM

## 2021-02-01 DIAGNOSIS — O3680X0 Pregnancy with inconclusive fetal viability, not applicable or unspecified: Secondary | ICD-10-CM

## 2021-02-01 DIAGNOSIS — O0991 Supervision of high risk pregnancy, unspecified, first trimester: Secondary | ICD-10-CM | POA: Diagnosis not present

## 2021-02-01 MED ORDER — BLOOD PRESSURE KIT DEVI: 1.0000 | 0 refills | Status: DC

## 2021-02-01 MED ORDER — GOJJI WEIGHT SCALE MISC: 1.0000 | 0 refills | Status: DC

## 2021-02-01 NOTE — Progress Notes (Signed)
New OB Intake  I connected with  Catherine [name] on 02/01/21 at  8:15 AM EST by in person and verified that I am speaking with the correct person using two identifiers. Nurse is located at [facility] and pt is located at [place].  I discussed the limitations, risks, security and privacy concerns of performing an evaluation and management service by telephone and the availability of in person appointments. I also discussed with the patient that there may be a patient responsible charge related to this service. The patient expressed understanding and agreed to proceed.  I explained I am completing New OB Intake today. We discussed her EDD of 09/02/21 that is based on early u/s. Pt is G2/P1001. I reviewed her allergies, medications, Medical/Surgical/OB history, and appropriate screenings. I informed her of [facility] services. Based on history, this is a/an  pregnancy complicated by hypertension .   Patient Active Problem List   Diagnosis Date Noted   Supervision of high risk pregnancy, antepartum 02/01/2021   Moderate episode of recurrent major depressive disorder ([place]) 01/05/2019   Essential hypertension 01/05/2019   PTSD (post-traumatic stress disorder) 03/18/2017    Concerns addressed today  Delivery Plans:  Plans to deliver at [facility].   MyChart/Babyscripts MyChart access verified. I explained pt will have some visits in office and some virtually. Babyscripts instructions given and order placed. Patient verifies receipt of registration text/e-mail. Account successfully created and app downloaded.  Blood Pressure Cuff  Blood pressure cuff ordered for patient to pick-up from [employer]. Explained after first prenatal appt pt will check weekly and document in 45.  Weight scale: Patient does not have weight scale. Weight scale ordered for patient to pick up form [facility].   Anatomy US Explained first scheduled [place] will be around 19 weeks. Dating and viability scan performed  today.  Labs Discussed Johnsie Cancel genetic screening with patient. Would like both Panorama and Horizon drawn at new OB visit. Routine prenatal labs needed.  Covid Vaccine Patient has not covid vaccine.    Informed patient of [facility] website  and placed link in her AVS.   Social Determinants of Health Food Insecurity: Patient denies food insecurity. WIC Referral: Patient is interested in referral to [facility].  Transportation: Patient denies transportation needs. Childcare: Discussed no children allowed at ultrasound appointments. Offered childcare services; patient declines childcare services at this time.  Send link to Pregnancy Navigators   Placed OB Box on problem list and updated  First visit review I reviewed new OB appt with pt. I explained she will have a pelvic exam, ob bloodwork with genetic screening, and PAP smear. Explained pt will be seen by Emeterio Reeve at first visit; encounter routed to appropriate provider. Explained that patient will be seen by pregnancy navigator following visit with provider. [facility] information placed in AVS.   Lucianne Lei, RN 02/01/2021  8:37 AM

## 2021-02-14 NOTE — Progress Notes (Signed)
   S:    PCP: Zelda   Patient arrives in good spirits. Presents to the clinic for hypertension evaluation, counseling, and management. Patient was initially referred and last seen by Primary Care Provider on 09/14/2020. BP was 150/96 mmHg at that visit. Amlodipine was added to pt's regimen. BP at goal when seen by CPP 9/6 and Angela on 11/16. At last visit, amlodipine and HCTZ were switched to labetalol given patient is now pregnant. BP at ultrasound appt 11/23 was at goal after these changes.   Today, patient arrives in good spirits. Denies dizziness, headaches, blurred vision, swelling, chest pain. She did have some morning sickness this morning but was able to take her medication after that.   Medication adherence reported.   Current BP Medications include:  labetalol 100 mg BID  Dietary habits include: Compliant with salt restriction. No longer drinking coffee in the morning.  Exercise habits include: Used to walk, not as much now Family / Social history:  -Fhx: HTN -Tobacco: current every day smoker -Alcohol: none   O:  Vitals:   02/15/21 1045  BP: 120/74  Pulse: 74   Home BP readings: checks BP weekly on Sundays with bicep cuff. 118-120/78-80  Last 3 Office BP readings: BP Readings from Last 3 Encounters:  02/15/21 120/74  02/01/21 118/70  01/25/21 107/67   BMET    Component Value Date/Time   NA 141 09/14/2020 1618   K 4.1 09/14/2020 1618   CL 102 09/14/2020 1618   CO2 25 09/14/2020 1618   GLUCOSE 78 09/14/2020 1618   GLUCOSE 142 (H) 10/13/2017 2157   BUN 9 09/14/2020 1618   CREATININE 0.79 09/14/2020 1618   CREATININE 0.59 12/11/2012 1600   CALCIUM 10.0 09/14/2020 1618   GFRNONAA 71 01/08/2019 0951   GFRAA 82 01/08/2019 0951   Renal function: CrCl cannot be calculated (Patient's most recent lab result is older than the maximum 21 days allowed.).  Clinical ASCVD: No  The 10-year ASCVD risk score (Arnett DK, et al., 2019) is: 1.8%   Values used to calculate  the score:     Age: 45 years     Sex: Female     Is Non-Hispanic African American: Yes     Diabetic: No     Tobacco smoker: Yes     Systolic Blood Pressure: 092 mmHg     Is BP treated: Yes     HDL Cholesterol: 62 mg/dL     Total Cholesterol: 162 mg/dL   A/P: Hypertension longstanding currently at goal on current medication regimen. BP Goal = < 130/80 mmHg. Medication adherence reported.  -Continued labetalol 100 mg BID.  -Counseled on lifestyle modifications for blood pressure control including reduced dietary sodium, increased exercise, adequate sleep.  Total time in face-to-face counseling 15 minutes. She has follow up scheduled with OB on 03/02/21. She can follow up with CPP as needed.    Rebbeca Paul, PharmD PGY2 Ambulatory Care Pharmacy Resident 02/15/2021 11:41 AM

## 2021-02-15 ENCOUNTER — Ambulatory Visit: Payer: Medicaid Other | Attending: Nurse Practitioner | Admitting: Pharmacist

## 2021-02-15 ENCOUNTER — Other Ambulatory Visit: Payer: Self-pay

## 2021-02-15 VITALS — BP 120/74 | HR 74

## 2021-02-15 DIAGNOSIS — I10 Essential (primary) hypertension: Secondary | ICD-10-CM | POA: Diagnosis not present

## 2021-03-01 ENCOUNTER — Telehealth: Payer: Self-pay | Admitting: *Deleted

## 2021-03-01 NOTE — Telephone Encounter (Signed)
Received message from [facility] regarding BP reading - 119/90 - no symptoms.  Attempt to call pt and discuss.  No answer, LVM making pt aware to retake and note in app and contact office.

## 2021-03-02 ENCOUNTER — Ambulatory Visit (INDEPENDENT_AMBULATORY_CARE_PROVIDER_SITE_OTHER): Payer: Medicaid Other | Admitting: Obstetrics & Gynecology

## 2021-03-02 ENCOUNTER — Other Ambulatory Visit: Payer: Self-pay

## 2021-03-02 ENCOUNTER — Other Ambulatory Visit (HOSPITAL_COMMUNITY)
Admission: RE | Admit: 2021-03-02 | Discharge: 2021-03-02 | Disposition: A | Discharge status: Home / Self Care | Payer: Medicaid Other | Source: Ambulatory Visit | Attending: Obstetrics & Gynecology | Admitting: Obstetrics & Gynecology

## 2021-03-02 VITALS — BP 128/81 | HR 73 | Wt 164.0 lb

## 2021-03-02 DIAGNOSIS — O10912 Unspecified pre-existing hypertension complicating pregnancy, second trimester: Secondary | ICD-10-CM | POA: Diagnosis not present

## 2021-03-02 DIAGNOSIS — O0990 Supervision of high risk pregnancy, unspecified, unspecified trimester: Secondary | ICD-10-CM | POA: Insufficient documentation

## 2021-03-02 DIAGNOSIS — O09522 Supervision of elderly multigravida, second trimester: Secondary | ICD-10-CM | POA: Diagnosis not present

## 2021-03-02 DIAGNOSIS — I10 Essential (primary) hypertension: Secondary | ICD-10-CM

## 2021-03-02 DIAGNOSIS — Z3480 Encounter for supervision of other normal pregnancy, unspecified trimester: Secondary | ICD-10-CM | POA: Diagnosis not present

## 2021-03-02 DIAGNOSIS — Z3A13 13 weeks gestation of pregnancy: Secondary | ICD-10-CM

## 2021-03-02 MED ORDER — ASPIRIN EC 81 MG PO TBEC: 81.0000 mg | DELAYED_RELEASE_TABLET | Freq: Every day | ORAL | 2 refills | Status: DC

## 2021-03-02 NOTE — Progress Notes (Signed)
Subjective:NOB 45 y/o    Catherine [name] is a G2P1001 [redacted]w[redacted]d being seen today for her first obstetrical visit.  Her obstetrical history is significant for advanced maternal age and 28 . Patient does intend to breast feed. Pregnancy history fully reviewed.  Patient reports nausea.  Vitals:   03/02/21 1323  BP: 128/81  Pulse: 73  Weight: 164 lb (74.4 kg)    HISTORY: OB History  Gravida Para Term Preterm AB Living  2 1 1  0 0 1  SAB IAB Ectopic Multiple Live Births  0 0 0 0 1    # Outcome Date GA Lbr Len/2nd Weight Sex Delivery Anes PTL Lv  2 Current           1 Term 12/21/12 [redacted]w[redacted]d 21:19 / 05:39 8 lb 9.7 oz (3.905 kg) F CS-LTranv EPI  LIV   Past Medical History:  Diagnosis Date   Advanced maternal age (AMA) in pregnancy    Anxiety 2017   Bipolar disorder ([place]) 2017   Chronic hypertension    Depression 2017   GERD (gastroesophageal reflux disease) 11/03/2012   Gestational diabetes    diet controlled   Gestational hypertension    Late prenatal care    Medical history non-contributory    Preeclampsia    Past Surgical History:  Procedure Laterality Date   CESAREAN SECTION N/A 12/21/2012   Procedure: CESAREAN SECTION;  Surgeon: Florian Buff, MD;  Location: [facility];  Service: Obstetrics;  Laterality: N/A;   LAPAROSCOPIC OVARIAN  2004   Family History  Problem Relation Age of Onset   Hypertension Mother    Aneurysm Mother    Hypertension Father    Alcohol abuse Neg Hx    Arthritis Neg Hx    Asthma Neg Hx    Birth defects Neg Hx    Cancer Neg Hx    COPD Neg Hx    Depression Neg Hx    Diabetes Neg Hx    Drug abuse Neg Hx    Early death Neg Hx    Hearing loss Neg Hx    Heart disease Neg Hx    Hyperlipidemia Neg Hx    Kidney disease Neg Hx    Learning disabilities Neg Hx    Mental illness Neg Hx    Mental retardation Neg Hx    Miscarriages / Stillbirths Neg Hx    Stroke Neg Hx    Vision loss Neg Hx      Exam     Uterus:   16 week  Pelvic Exam:    Perineum: No Hemorrhoids   Vulva: normal   Vagina:  normal mucosa   pH:    Cervix: no lesions   Adnexa: normal adnexa   Bony Pelvis: average  System: Breast:  normal appearance, no masses or tenderness   Skin: normal coloration and turgor, no rashes    Neurologic: oriented, normal mood   Extremities: normal strength, tone, and muscle mass   HEENT PERRLA and sclera clear, anicteric   Mouth/Teeth dental hygiene good   Neck supple   Cardiovascular: regular rate and rhythm   Respiratory:  appears well, vitals normal, no respiratory distress, acyanotic, normal RR, ear and throat exam is normal   Abdomen: soft, non-tender; bowel sounds normal; no masses,  no organomegaly   Urinary: urethral meatus normal      Assessment:    Pregnancy: G2P1001 Patient Active Problem List   Diagnosis Date Noted   Supervision of high risk pregnancy, antepartum 02/01/2021   Moderate  episode of recurrent major depressive disorder ([place]) 01/05/2019   Essential hypertension 01/05/2019   PTSD (post-traumatic stress disorder) 03/18/2017        Plan:     Initial labs drawn. Prenatal vitamins. Problem list reviewed and updated. Genetic Screening discussed : ordered.  Ultrasound discussed; fetal survey: ordered.  Follow up in 4 weeks. 50% of 30 min visit spent on counseling and coordination of care.  ASA 81 mg/d Continue labetalol   Emeterio Reeve 03/03/2021

## 2021-03-02 NOTE — Progress Notes (Signed)
NEW OB, declined FLU vaccine.

## 2021-03-03 LAB — OBSTETRIC PANEL, INCLUDING HIV
Antibody Screen: NEGATIVE
Basophils Absolute: 0.1 10*3/uL (ref 0.0–0.2)
Basos: 1 %
EOS (ABSOLUTE): 0.1 10*3/uL (ref 0.0–0.4)
Eos: 2 %
HIV Screen 4th Generation wRfx: NONREACTIVE
Hematocrit: 33.4 % — ABNORMAL LOW (ref 34.0–46.6)
Hemoglobin: 11.3 g/dL (ref 11.1–15.9)
Hepatitis B Surface Ag: NEGATIVE
Immature Grans (Abs): 0 10*3/uL (ref 0.0–0.1)
Immature Granulocytes: 0 %
Lymphocytes Absolute: 1.8 10*3/uL (ref 0.7–3.1)
Lymphs: 20 %
MCH: 29.7 pg (ref 26.6–33.0)
MCHC: 33.8 g/dL (ref 31.5–35.7)
MCV: 88 fL (ref 79–97)
Monocytes Absolute: 0.7 10*3/uL (ref 0.1–0.9)
Monocytes: 8 %
Neutrophils Absolute: 6.1 10*3/uL (ref 1.4–7.0)
Neutrophils: 69 %
Platelets: 249 10*3/uL (ref 150–450)
RBC: 3.81 x10E6/uL (ref 3.77–5.28)
RDW: 13.1 % (ref 11.7–15.4)
RPR Ser Ql: NONREACTIVE
Rh Factor: POSITIVE
Rubella Antibodies, IGG: 1.25 index (ref >0.99)
WBC: 8.8 10*3/uL (ref 3.4–10.8)

## 2021-03-03 LAB — HEPATITIS C ANTIBODY: Hep C Virus Ab: <0.1 s/co ratio (ref 0.0–0.9)

## 2021-03-07 LAB — CERVICOVAGINAL ANCILLARY ONLY
Chlamydia: NEGATIVE
Comment: NEGATIVE
Comment: NORMAL
Neisseria Gonorrhea: NEGATIVE

## 2021-03-08 LAB — URINE CULTURE, OB REFLEX

## 2021-03-08 LAB — CULTURE, OB URINE

## 2021-03-14 ENCOUNTER — Telehealth: Payer: Self-pay

## 2021-03-14 DIAGNOSIS — O285 Abnormal chromosomal and genetic finding on antenatal screening of mother: Secondary | ICD-10-CM

## 2021-03-14 NOTE — Telephone Encounter (Signed)
Attempted to contact patient x2 to discuss results for her genetic screening and the need for genetic counseling. No answer. Unable to leave vm.

## 2021-03-15 ENCOUNTER — Other Ambulatory Visit: Payer: Self-pay | Admitting: Obstetrics & Gynecology

## 2021-03-15 DIAGNOSIS — O2340 Unspecified infection of urinary tract in pregnancy, unspecified trimester: Secondary | ICD-10-CM

## 2021-03-15 MED ORDER — NITROFURANTOIN MONOHYD MACRO 100 MG PO CAPS: 100.0000 mg | ORAL_CAPSULE | Freq: Two times a day (BID) | ORAL | 0 refills | Status: DC

## 2021-03-15 NOTE — Progress Notes (Signed)
Meds ordered this encounter  Medications   nitrofurantoin, macrocrystal-monohydrate, (MACROBID) 100 MG capsule    Sig: Take 1 capsule (100 mg total) by mouth 2 (two) times daily.    Dispense:  14 capsule    Refill:  0    

## 2021-03-16 ENCOUNTER — Telehealth: Payer: Self-pay

## 2021-03-16 NOTE — Telephone Encounter (Signed)
I spoke with Dr. Kennon Rounds regarding abnormal results.  Spoke with patient regarding abnormal genetic screening results for increased risk for trisomy 21. Patient informed that she has been scheduled to see genetic counseling on 1/6 regarding abnormal results.   Patient verbalized understanding.

## 2021-03-17 ENCOUNTER — Ambulatory Visit: Payer: Medicaid Other | Attending: Obstetrics & Gynecology

## 2021-03-30 ENCOUNTER — Encounter: Payer: Medicaid Other | Admitting: Obstetrics and Gynecology

## 2021-04-03 ENCOUNTER — Ambulatory Visit: Payer: Medicaid Other | Attending: Obstetrics and Gynecology | Admitting: *Deleted

## 2021-04-03 ENCOUNTER — Telehealth: Payer: Self-pay | Admitting: *Deleted

## 2021-04-03 ENCOUNTER — Ambulatory Visit (HOSPITAL_BASED_OUTPATIENT_CLINIC_OR_DEPARTMENT_OTHER): Payer: Medicaid Other | Admitting: Obstetrics

## 2021-04-03 ENCOUNTER — Ambulatory Visit (HOSPITAL_BASED_OUTPATIENT_CLINIC_OR_DEPARTMENT_OTHER): Payer: Medicaid Other

## 2021-04-03 ENCOUNTER — Other Ambulatory Visit: Payer: Self-pay | Admitting: *Deleted

## 2021-04-03 ENCOUNTER — Other Ambulatory Visit: Payer: Self-pay

## 2021-04-03 VITALS — BP 124/76 | HR 77

## 2021-04-03 DIAGNOSIS — O10912 Unspecified pre-existing hypertension complicating pregnancy, second trimester: Secondary | ICD-10-CM | POA: Diagnosis not present

## 2021-04-03 DIAGNOSIS — O09522 Supervision of elderly multigravida, second trimester: Secondary | ICD-10-CM

## 2021-04-03 DIAGNOSIS — O0990 Supervision of high risk pregnancy, unspecified, unspecified trimester: Secondary | ICD-10-CM

## 2021-04-03 DIAGNOSIS — Z7901 Long term (current) use of anticoagulants: Secondary | ICD-10-CM | POA: Insufficient documentation

## 2021-04-03 DIAGNOSIS — O359XX0 Maternal care for (suspected) fetal abnormality and damage, unspecified, not applicable or unspecified: Secondary | ICD-10-CM

## 2021-04-03 DIAGNOSIS — O0992 Supervision of high risk pregnancy, unspecified, second trimester: Secondary | ICD-10-CM | POA: Insufficient documentation

## 2021-04-03 DIAGNOSIS — Z363 Encounter for antenatal screening for malformations: Secondary | ICD-10-CM | POA: Insufficient documentation

## 2021-04-03 DIAGNOSIS — D259 Leiomyoma of uterus, unspecified: Secondary | ICD-10-CM

## 2021-04-03 DIAGNOSIS — O280 Abnormal hematological finding on antenatal screening of mother: Secondary | ICD-10-CM | POA: Diagnosis not present

## 2021-04-03 DIAGNOSIS — Z3A18 18 weeks gestation of pregnancy: Secondary | ICD-10-CM | POA: Diagnosis not present

## 2021-04-03 DIAGNOSIS — O10919 Unspecified pre-existing hypertension complicating pregnancy, unspecified trimester: Secondary | ICD-10-CM

## 2021-04-03 DIAGNOSIS — O3412 Maternal care for benign tumor of corpus uteri, second trimester: Secondary | ICD-10-CM

## 2021-04-04 NOTE — Progress Notes (Addendum)
MFM Note  Catherine [name] was seen for a detailed fetal anatomy scan due to advanced maternal age (46 years old).  Her cell free DNA test indicated a high risk for Down syndrome.  A female fetus is predicted.  She also has a history of chronic hypertension that is treated with labetalol 100 mg twice a day.  She denies any other significant past medical history and denies any problems in her current pregnancy.    She was informed that the fetal growth and amniotic fluid level were appropriate for her gestational age.   The following were noted markers/anomalies were noted on today's exam:  Ventriculomegaly in the fetal brain with dilated third ventricle possibly indicating aqueduct of stenosis Possible absent cavum of septum pellucidum Thickened nuchal fold The fetal cerebellum measured about 2 weeks behind her dates Absent nasal bone Intracardiac echogenic focus Possible chest mass Echogenic bowel Left pyelectasis in the fetal kidney Possible clinodactyly in the hands  The patient was advised that these ultrasound findings along with her positive cell free DNA test significantly increases her her risk of having a baby with Down syndrome.  The patient became extremely angry as she did not want to talk about anything dealing with genetics.  She stated that she believes in God and that everything will be okay.  She also stated that she was told that her last child had abnormalities and that child was born without any abnormalities.  She is by herself and cannot spend time worrying about all of these issues.  She was offered and declined an amniocentesis for definitive diagnosis of fetal aneuploidy.  The patient then requested that the exam be ended.    She was advised that we will continue to follow her with growth ultrasounds throughout her pregnancy.  She requested that genetic issues not be discussed with her during her future exams.  She understands that ultrasound findings will have to  be discussed with her during her future visits.  Due to the multiple anomalies and the possibility of a chromosomal issue, there is an increased risk of IUFD.  Due to her history of chronic hypertension, there is an increased risk of superimposed preeclampsia, an indicated preterm delivery, and possible fetal growth restriction. She should continue taking a daily baby aspirin for preeclampsia prophylaxis.   A large 8 cm fibroid was noted in the lower uterine segment today. The increased risk of maternal pain issues and fetal growth issues later in her pregnancy due to the fibroids was discussed.   The patient declined to meet with the genetic counselor today.  A follow-up exam was scheduled in 3 weeks.  A total of 30 minutes was spent counseling and coordinating the care for this patient.  Greater than 50% of the time was spent in direct face-to-face contact.

## 2021-04-06 ENCOUNTER — Encounter: Payer: Self-pay | Admitting: Obstetrics

## 2021-04-10 ENCOUNTER — Ambulatory Visit: Payer: Medicaid Other

## 2021-04-10 ENCOUNTER — Other Ambulatory Visit: Payer: Self-pay

## 2021-04-13 DIAGNOSIS — O359XX0 Maternal care for (suspected) fetal abnormality and damage, unspecified, not applicable or unspecified: Secondary | ICD-10-CM | POA: Insufficient documentation

## 2021-04-24 ENCOUNTER — Ambulatory Visit: Payer: Medicaid Other

## 2021-04-24 ENCOUNTER — Ambulatory Visit: Payer: Medicaid Other | Attending: Obstetrics

## 2021-05-01 ENCOUNTER — Other Ambulatory Visit: Payer: Self-pay | Admitting: Physician Assistant

## 2021-05-01 DIAGNOSIS — I10 Essential (primary) hypertension: Secondary | ICD-10-CM

## 2021-05-01 DIAGNOSIS — Z3490 Encounter for supervision of normal pregnancy, unspecified, unspecified trimester: Secondary | ICD-10-CM

## 2021-05-02 NOTE — Telephone Encounter (Signed)
Requested Prescriptions  Pending Prescriptions Disp Refills   labetalol (NORMODYNE) 100 MG tablet [Pharmacy Med Name: LABETALOL 100MG  TABLETS] 60 tablet 3    Sig: TAKE 1 TABLET(100 MG) BY MOUTH TWICE DAILY     Cardiovascular:  Beta Blockers Passed - 05/01/2021 12:26 PM      Passed - Last BP in normal range    BP Readings from Last 1 Encounters:  04/03/21 124/76         Passed - Last Heart Rate in normal range    Pulse Readings from Last 1 Encounters:  04/03/21 77         Passed - Valid encounter within last 6 months    Recent Outpatient Visits          2 months ago Essential hypertension   [place], Jarome Matin, RPH-CPP   3 months ago Pregnancy, unspecified gestational age   Magazine Lueders, [place], [place]   5 months ago Essential hypertension   Nikolai, Jarome Matin, RPH-CPP   6 months ago Essential hypertension   [place], Stephen L, RPH-CPP   7 months ago Essential hypertension   [place], Vernia Buff, NP

## 2021-07-19 ENCOUNTER — Encounter: Payer: Medicaid Other | Admitting: Obstetrics and Gynecology

## 2021-08-02 ENCOUNTER — Encounter: Payer: Self-pay | Admitting: Obstetrics and Gynecology

## 2021-08-02 ENCOUNTER — Ambulatory Visit (INDEPENDENT_AMBULATORY_CARE_PROVIDER_SITE_OTHER): Payer: Medicaid Other | Admitting: Obstetrics and Gynecology

## 2021-08-02 ENCOUNTER — Other Ambulatory Visit (HOSPITAL_COMMUNITY)
Admission: RE | Admit: 2021-08-02 | Discharge: 2021-08-02 | Disposition: A | Discharge status: Home / Self Care | Payer: Medicaid Other | Source: Ambulatory Visit | Attending: Obstetrics and Gynecology | Admitting: Obstetrics and Gynecology

## 2021-08-02 VITALS — BP 143/88 | HR 85 | Wt 183.2 lb

## 2021-08-02 DIAGNOSIS — O0990 Supervision of high risk pregnancy, unspecified, unspecified trimester: Secondary | ICD-10-CM | POA: Insufficient documentation

## 2021-08-02 DIAGNOSIS — Z23 Encounter for immunization: Secondary | ICD-10-CM | POA: Diagnosis not present

## 2021-08-02 DIAGNOSIS — O10913 Unspecified pre-existing hypertension complicating pregnancy, third trimester: Secondary | ICD-10-CM

## 2021-08-02 DIAGNOSIS — O0933 Supervision of pregnancy with insufficient antenatal care, third trimester: Secondary | ICD-10-CM

## 2021-08-02 DIAGNOSIS — O359XX0 Maternal care for (suspected) fetal abnormality and damage, unspecified, not applicable or unspecified: Secondary | ICD-10-CM | POA: Diagnosis not present

## 2021-08-02 DIAGNOSIS — I10 Essential (primary) hypertension: Secondary | ICD-10-CM | POA: Diagnosis not present

## 2021-08-02 NOTE — Addendum Note (Signed)
Addended by: Flonnie Hailstone on: 08/02/2021 09:13 AM   Modules accepted: Orders

## 2021-08-02 NOTE — Progress Notes (Signed)
   PRENATAL VISIT NOTE  Subjective:  Catherine [name] is a 46 y.o. G2P1001 at 5w4dbeing seen today for ongoing prenatal care.  She is currently monitored for the following issues for this high-risk pregnancy and has PTSD (post-traumatic stress disorder); Moderate episode of recurrent major depressive disorder ([place]; Essential hypertension; Supervision of high risk pregnancy, antepartum; Known fetal anomaly, antepartum; Prenatal care insufficient, third trimester; and Chronic hypertension in obstetric context in third trimester on their problem list.  Patient doing well with no acute concerns today. She reports no complaints.  Contractions: Irritability. Vag. Bleeding: None.  Movement: Present. Denies leaking of fluid.  Pt denies headache, visual changes and RUQ pain The following portions of the patient's history were reviewed and updated as appropriate: allergies, current medications, past family history, past medical history, past social history, past surgical history and problem list. Problem list updated.  Objective:   Vitals:   08/02/21 0829 08/02/21 0847  BP: (!) 147/87 (!) 143/88  Pulse: 85   Weight: 183 lb 3.2 oz (83.1 kg)     Fetal Status: Fetal Heart Rate (bpm): 134 Fundal Height: 39 cm Movement: Present     General:  Alert, oriented and cooperative. Patient is in no acute distress.  Skin: Skin is warm and dry. No rash noted.   Cardiovascular: Normal heart rate noted  Respiratory: Normal respiratory effort, no problems with respiration noted  Abdomen: Soft, gravid, appropriate for gestational age.  Pain/Pressure: Present     Pelvic: Cervical exam deferred        Extremities: Normal range of motion.  Edema: Trace  Mental Status:  Normal mood and affect. Normal behavior. Normal judgment and thought content.   Assessment and Plan:  Pregnancy: G2P1001 at 31w4d1. Supervision of high risk pregnancy, antepartum Pt has had extremely limited care due to her noncompliance, will  get third trimester labs, probably too late for 2 hour GTT    - CBC - RPR - HIV Antibody (routine testing w rflx) - Strep Gp B NAA - GC/Chlamydia probe amp ([place])[facility]. Essential hypertension  - [place] OB FOLLOW UP; Future  3. Known fetal anomaly, antepartum, single or unspecified fetus Will get growth scan which is overdue and MFM follow up  4. Prenatal care insufficient, third trimester Emphasized need for consistent prenatal care Pt has not been seen since Dec/Jan due to mistrust of possible T21 diagnosis.  Will get patient caught up on labs and prepare for possible earlier delivery due to chronic health problems  5. Chronic hypertension in obstetric context in third trimester Growth scan, start weekly NST/BPP If blood pressure remains above criteria, likely delivery at or around 37 weeks  Preterm labor symptoms and general obstetric precautions including but not limited to vaginal bleeding, contractions, leaking of fluid and fetal movement were reviewed in detail with the patient.  Please refer to After Visit Summary for other counseling recommendations.   Return in about 1 week (around 08/09/2021) [facility].   LaLynnda ShieldsMD Faculty Attending Center for [facility]

## 2021-08-03 LAB — RPR: RPR Ser Ql: NONREACTIVE

## 2021-08-03 LAB — GC/CHLAMYDIA PROBE AMP (~~LOC~~) NOT AT ARMC
Chlamydia: NEGATIVE
Comment: NEGATIVE
Comment: NORMAL
Neisseria Gonorrhea: NEGATIVE

## 2021-08-03 LAB — CBC
Hematocrit: 28 % — ABNORMAL LOW (ref 34.0–46.6)
Hemoglobin: 9.3 g/dL — ABNORMAL LOW (ref 11.1–15.9)
MCH: 28.1 pg (ref 26.6–33.0)
MCHC: 33.2 g/dL (ref 31.5–35.7)
MCV: 85 fL (ref 79–97)
Platelets: 183 10*3/uL (ref 150–450)
RBC: 3.31 x10E6/uL — ABNORMAL LOW (ref 3.77–5.28)
RDW: 13.7 % (ref 11.7–15.4)
WBC: 9.7 10*3/uL (ref 3.4–10.8)

## 2021-08-03 LAB — HIV ANTIBODY (ROUTINE TESTING W REFLEX): HIV Screen 4th Generation wRfx: NONREACTIVE

## 2021-08-04 LAB — STREP GP B NAA: Strep Gp B NAA: NEGATIVE

## 2021-08-08 ENCOUNTER — Ambulatory Visit: Payer: Medicaid Other | Admitting: *Deleted

## 2021-08-08 ENCOUNTER — Ambulatory Visit: Payer: Medicaid Other | Attending: Obstetrics and Gynecology

## 2021-08-08 VITALS — BP 129/80 | HR 86

## 2021-08-08 DIAGNOSIS — O09523 Supervision of elderly multigravida, third trimester: Secondary | ICD-10-CM | POA: Diagnosis not present

## 2021-08-08 DIAGNOSIS — O0990 Supervision of high risk pregnancy, unspecified, unspecified trimester: Secondary | ICD-10-CM | POA: Insufficient documentation

## 2021-08-08 DIAGNOSIS — O35BXX0 Maternal care for other (suspected) fetal abnormality and damage, fetal cardiac anomalies, not applicable or unspecified: Secondary | ICD-10-CM

## 2021-08-08 DIAGNOSIS — I10 Essential (primary) hypertension: Secondary | ICD-10-CM | POA: Insufficient documentation

## 2021-08-08 DIAGNOSIS — O10013 Pre-existing essential hypertension complicating pregnancy, third trimester: Secondary | ICD-10-CM | POA: Diagnosis not present

## 2021-08-08 DIAGNOSIS — O285 Abnormal chromosomal and genetic finding on antenatal screening of mother: Secondary | ICD-10-CM | POA: Diagnosis not present

## 2021-08-08 DIAGNOSIS — O34219 Maternal care for unspecified type scar from previous cesarean delivery: Secondary | ICD-10-CM

## 2021-08-08 DIAGNOSIS — Z3A36 36 weeks gestation of pregnancy: Secondary | ICD-10-CM

## 2021-08-09 ENCOUNTER — Other Ambulatory Visit: Payer: Self-pay | Admitting: *Deleted

## 2021-08-09 DIAGNOSIS — O10913 Unspecified pre-existing hypertension complicating pregnancy, third trimester: Secondary | ICD-10-CM

## 2021-08-09 DIAGNOSIS — O0933 Supervision of pregnancy with insufficient antenatal care, third trimester: Secondary | ICD-10-CM

## 2021-08-09 DIAGNOSIS — O3509X0 Maternal care for (suspected) other central nervous system malformation or damage in fetus, not applicable or unspecified: Secondary | ICD-10-CM

## 2021-08-09 DIAGNOSIS — O09523 Supervision of elderly multigravida, third trimester: Secondary | ICD-10-CM

## 2021-08-10 ENCOUNTER — Encounter: Payer: Self-pay | Admitting: Obstetrics and Gynecology

## 2021-08-10 ENCOUNTER — Ambulatory Visit (INDEPENDENT_AMBULATORY_CARE_PROVIDER_SITE_OTHER): Payer: Medicaid Other | Admitting: Obstetrics and Gynecology

## 2021-08-10 VITALS — BP 130/84 | HR 90 | Wt 183.4 lb

## 2021-08-10 DIAGNOSIS — O0990 Supervision of high risk pregnancy, unspecified, unspecified trimester: Secondary | ICD-10-CM

## 2021-08-10 DIAGNOSIS — O09523 Supervision of elderly multigravida, third trimester: Secondary | ICD-10-CM

## 2021-08-10 DIAGNOSIS — O359XX0 Maternal care for (suspected) fetal abnormality and damage, unspecified, not applicable or unspecified: Secondary | ICD-10-CM | POA: Diagnosis not present

## 2021-08-10 DIAGNOSIS — O10913 Unspecified pre-existing hypertension complicating pregnancy, third trimester: Secondary | ICD-10-CM

## 2021-08-10 DIAGNOSIS — Z3A36 36 weeks gestation of pregnancy: Secondary | ICD-10-CM

## 2021-08-10 DIAGNOSIS — O0993 Supervision of high risk pregnancy, unspecified, third trimester: Secondary | ICD-10-CM

## 2021-08-10 DIAGNOSIS — O321XX0 Maternal care for breech presentation, not applicable or unspecified: Secondary | ICD-10-CM

## 2021-08-10 NOTE — Progress Notes (Signed)
   PRENATAL VISIT NOTE  Subjective:  Janaisa Birkland is a 46 y.o. G2P1001 at 48w5dbeing seen today for ongoing prenatal care.  She is currently monitored for the following issues for this high-risk pregnancy and has PTSD (post-traumatic stress disorder); Moderate episode of recurrent major depressive disorder ([place]; Essential hypertension; Supervision of high risk pregnancy, antepartum; Known fetal anomaly, antepartum; Prenatal care insufficient, third trimester; Chronic hypertension in obstetric context in third trimester; Advanced maternal age in multigravida, third trimester; and Breech presentation on their problem list.  Patient doing well with no acute concerns today. She reports no complaints.  Contractions: Not present. Vag. Bleeding: None.  Movement: Present. Denies leaking of fluid.   The following portions of the patient's history were reviewed and updated as appropriate: allergies, current medications, past family history, past medical history, past social history, past surgical history and problem list. Problem list updated.  Objective:   Vitals:   08/10/21 0833  BP: 130/84  Pulse: 90  Weight: 183 lb 6.4 oz (83.2 kg)    Fetal Status: Fetal Heart Rate (bpm): 144 Fundal Height: 38 cm Movement: Present     General:  Alert, oriented and cooperative. Patient is in no acute distress.  Skin: Skin is warm and dry. No rash noted.   Cardiovascular: Normal heart rate noted  Respiratory: Normal respiratory effort, no problems with respiration noted  Abdomen: Soft, gravid, appropriate for gestational age.  Pain/Pressure: Absent     Pelvic: Cervical exam deferred        Extremities: Normal range of motion.  Edema: None  Mental Status:  Normal mood and affect. Normal behavior. Normal judgment and thought content.   Assessment and Plan:  Pregnancy: G2P1001 at 356w5d1. Supervision of high risk pregnancy, antepartum Continue routine prenatal care  2. [redacted] weeks gestation of  pregnancy   3. Chronic hypertension in obstetric context in third trimester BP WNL today, pt will have weekly BPP until delivery  4. Known fetal anomaly, antepartum, single or unspecified fetus U/S findings and labwork suspicious for T21, pt does not like to discuss these findings  5. Advanced maternal age in multigravida, third trimester   6. Breech presentation, single or unspecified fetus Breech diagnosed at last u/s, pt should be scheduled for primary c section at 29 weeks per note  Preterm labor symptoms and general obstetric precautions including but not limited to vaginal bleeding, contractions, leaking of fluid and fetal movement were reviewed in detail with the patient.  Please refer to After Visit Summary for other counseling recommendations.   Return in about 1 week (around 08/17/2021) [facility]   LaLynnda ShieldsMD Faculty Attending Center for [facility]

## 2021-08-10 NOTE — Progress Notes (Signed)
Pt presents ROB visit with no concerns at this time.

## 2021-08-11 ENCOUNTER — Encounter (HOSPITAL_COMMUNITY): Payer: Self-pay

## 2021-08-11 ENCOUNTER — Telehealth (HOSPITAL_COMMUNITY): Payer: Self-pay | Admitting: *Deleted

## 2021-08-11 NOTE — Telephone Encounter (Signed)
Preadmission screen  

## 2021-08-11 NOTE — Patient Instructions (Signed)
Catherine [name]  08/11/2021   Your procedure is scheduled on:  08/26/2021  Arrive at 0930 at [employer] [facility] on [address] at [facility]  and [employer] [employer] [address]. You are invited to use the FREE valet parking or use the Visitor's parking deck.  Pick up the phone at the desk and dial [phone].  Call this number if you have problems the morning of surgery: [phone]  Remember:   Do not eat food:(After Midnight) Desps de medianoche.  Do not drink clear liquids: (After Midnight) Desps de medianoche.  Take these medicines the morning of surgery with A SIP OF WATER:  Take labetalol as prescribed   Do not wear jewelry, make-up or nail polish.  Do not wear lotions, powders, or perfumes. Do not wear deodorant.  Do not shave 48 hours prior to surgery.  Do not bring valuables to the hospital.  [facility] is not   responsible for any belongings or valuables brought to the hospital.  Contacts, dentures or bridgework may not be worn into surgery.  Leave suitcase in the car. After surgery it may be brought to your room.  For patients admitted to the hospital, checkout time is 11:00 AM the day of              discharge.      Please read over the following fact sheets that you were given:     Preparing for Surgery

## 2021-08-14 ENCOUNTER — Telehealth (HOSPITAL_COMMUNITY): Payer: Self-pay | Admitting: *Deleted

## 2021-08-14 ENCOUNTER — Ambulatory Visit: Payer: Medicaid Other

## 2021-08-14 ENCOUNTER — Ambulatory Visit: Payer: Medicaid Other | Attending: Obstetrics and Gynecology

## 2021-08-14 NOTE — Telephone Encounter (Signed)
Preadmission screen  

## 2021-08-15 ENCOUNTER — Telehealth (HOSPITAL_COMMUNITY): Payer: Self-pay | Admitting: *Deleted

## 2021-08-15 NOTE — Telephone Encounter (Signed)
Preadmission screen  

## 2021-08-16 ENCOUNTER — Telehealth (HOSPITAL_COMMUNITY): Payer: Self-pay | Admitting: *Deleted

## 2021-08-16 NOTE — Telephone Encounter (Signed)
Preadmission screen  

## 2021-08-17 ENCOUNTER — Telehealth (HOSPITAL_COMMUNITY): Payer: Self-pay | Admitting: *Deleted

## 2021-08-17 ENCOUNTER — Telehealth (INDEPENDENT_AMBULATORY_CARE_PROVIDER_SITE_OTHER): Payer: Medicaid Other | Admitting: Obstetrics

## 2021-08-17 DIAGNOSIS — O0990 Supervision of high risk pregnancy, unspecified, unspecified trimester: Secondary | ICD-10-CM

## 2021-08-17 NOTE — Progress Notes (Signed)
Patient did not answer phone.  Shelly Bombard, MD 08/17/2021 3:18 PM

## 2021-08-17 NOTE — Telephone Encounter (Signed)
Preadmission screen  

## 2021-08-18 ENCOUNTER — Telehealth (HOSPITAL_COMMUNITY): Payer: Self-pay | Admitting: *Deleted

## 2021-08-18 NOTE — Telephone Encounter (Signed)
Preadmission screen  

## 2021-08-20 ENCOUNTER — Inpatient Hospital Stay (HOSPITAL_COMMUNITY)
Admission: AD | Admit: 2021-08-20 | Discharge: 2021-08-22 | DRG: 787 | Disposition: A | Discharge status: Home / Self Care | Payer: Medicaid Other | Attending: Obstetrics & Gynecology | Admitting: Obstetrics & Gynecology

## 2021-08-20 ENCOUNTER — Other Ambulatory Visit: Payer: Self-pay

## 2021-08-20 ENCOUNTER — Inpatient Hospital Stay (HOSPITAL_COMMUNITY): Payer: Medicaid Other | Admitting: Anesthesiology

## 2021-08-20 ENCOUNTER — Encounter (HOSPITAL_COMMUNITY): Payer: Self-pay | Admitting: Obstetrics and Gynecology

## 2021-08-20 ENCOUNTER — Encounter (HOSPITAL_COMMUNITY)
Admission: AD | Disposition: A | Discharge status: Home / Self Care | Payer: Self-pay | Source: Home / Self Care | Attending: Obstetrics & Gynecology

## 2021-08-20 DIAGNOSIS — O34211 Maternal care for low transverse scar from previous cesarean delivery: Secondary | ICD-10-CM | POA: Diagnosis not present

## 2021-08-20 DIAGNOSIS — O321XX1 Maternal care for breech presentation, fetus 1: Secondary | ICD-10-CM | POA: Diagnosis not present

## 2021-08-20 DIAGNOSIS — O1002 Pre-existing essential hypertension complicating childbirth: Principal | ICD-10-CM | POA: Diagnosis present

## 2021-08-20 DIAGNOSIS — O328XX0 Maternal care for other malpresentation of fetus, not applicable or unspecified: Secondary | ICD-10-CM | POA: Diagnosis present

## 2021-08-20 DIAGNOSIS — O321XX0 Maternal care for breech presentation, not applicable or unspecified: Secondary | ICD-10-CM | POA: Diagnosis present

## 2021-08-20 DIAGNOSIS — Z98891 History of uterine scar from previous surgery: Principal | ICD-10-CM

## 2021-08-20 DIAGNOSIS — F17210 Nicotine dependence, cigarettes, uncomplicated: Secondary | ICD-10-CM | POA: Diagnosis present

## 2021-08-20 DIAGNOSIS — O358XX0 Maternal care for other (suspected) fetal abnormality and damage, not applicable or unspecified: Secondary | ICD-10-CM | POA: Diagnosis present

## 2021-08-20 DIAGNOSIS — D62 Acute posthemorrhagic anemia: Secondary | ICD-10-CM | POA: Diagnosis not present

## 2021-08-20 DIAGNOSIS — O26893 Other specified pregnancy related conditions, third trimester: Secondary | ICD-10-CM | POA: Diagnosis present

## 2021-08-20 DIAGNOSIS — O9081 Anemia of the puerperium: Secondary | ICD-10-CM | POA: Diagnosis not present

## 2021-08-20 DIAGNOSIS — O359XX1 Maternal care for (suspected) fetal abnormality and damage, unspecified, fetus 1: Secondary | ICD-10-CM | POA: Diagnosis not present

## 2021-08-20 DIAGNOSIS — Z3A38 38 weeks gestation of pregnancy: Secondary | ICD-10-CM

## 2021-08-20 DIAGNOSIS — O359XX0 Maternal care for (suspected) fetal abnormality and damage, unspecified, not applicable or unspecified: Secondary | ICD-10-CM | POA: Diagnosis present

## 2021-08-20 DIAGNOSIS — O0933 Supervision of pregnancy with insufficient antenatal care, third trimester: Secondary | ICD-10-CM

## 2021-08-20 DIAGNOSIS — O99334 Smoking (tobacco) complicating childbirth: Secondary | ICD-10-CM | POA: Diagnosis present

## 2021-08-20 DIAGNOSIS — O10913 Unspecified pre-existing hypertension complicating pregnancy, third trimester: Secondary | ICD-10-CM | POA: Diagnosis present

## 2021-08-20 DIAGNOSIS — O0990 Supervision of high risk pregnancy, unspecified, unspecified trimester: Secondary | ICD-10-CM

## 2021-08-20 DIAGNOSIS — O09523 Supervision of elderly multigravida, third trimester: Secondary | ICD-10-CM | POA: Diagnosis present

## 2021-08-20 DIAGNOSIS — O1092 Unspecified pre-existing hypertension complicating childbirth: Secondary | ICD-10-CM | POA: Diagnosis not present

## 2021-08-20 LAB — TYPE AND SCREEN
ABO/RH(D): O POS
Antibody Screen: NEGATIVE

## 2021-08-20 LAB — URINALYSIS, ROUTINE W REFLEX MICROSCOPIC
Bilirubin Urine: NEGATIVE
Glucose, UA: NEGATIVE mg/dL
Hgb urine dipstick: NEGATIVE
Ketones, ur: NEGATIVE mg/dL
Nitrite: NEGATIVE
Protein, ur: 30 mg/dL — AB
Specific Gravity, Urine: 1.019 (ref 1.005–1.030)
pH: 6 (ref 5.0–8.0)

## 2021-08-20 LAB — CBC WITH DIFFERENTIAL/PLATELET
Abs Immature Granulocytes: 0.13 10*3/uL — ABNORMAL HIGH (ref 0.00–0.07)
Basophils Absolute: 0.1 10*3/uL (ref 0.0–0.1)
Basophils Relative: 1 %
Eosinophils Absolute: 0.3 10*3/uL (ref 0.0–0.5)
Eosinophils Relative: 2 %
HCT: 28.7 % — ABNORMAL LOW (ref 36.0–46.0)
Hemoglobin: 9.1 g/dL — ABNORMAL LOW (ref 12.0–15.0)
Immature Granulocytes: 1 %
Lymphocytes Relative: 12 %
Lymphs Abs: 1.7 10*3/uL (ref 0.7–4.0)
MCH: 27.8 pg (ref 26.0–34.0)
MCHC: 31.7 g/dL (ref 30.0–36.0)
MCV: 87.8 fL (ref 80.0–100.0)
Monocytes Absolute: 1.3 10*3/uL — ABNORMAL HIGH (ref 0.1–1.0)
Monocytes Relative: 9 %
Neutro Abs: 11.4 10*3/uL — ABNORMAL HIGH (ref 1.7–7.7)
Neutrophils Relative %: 75 %
Platelets: 182 10*3/uL (ref 150–400)
RBC: 3.27 MIL/uL — ABNORMAL LOW (ref 3.87–5.11)
RDW: 15.2 % (ref 11.5–15.5)
WBC: 14.8 10*3/uL — ABNORMAL HIGH (ref 4.0–10.5)
nRBC: 0 % (ref 0.0–0.2)

## 2021-08-20 LAB — HEMOGLOBIN A1C
Hgb A1c MFr Bld: 5.5 % (ref 4.8–5.6)
Mean Plasma Glucose: 111.15 mg/dL

## 2021-08-20 LAB — ABO/RH: ABO/RH(D): O POS

## 2021-08-20 SURGERY — Surgical Case
Anesthesia: Spinal | Wound class: Clean Contaminated

## 2021-08-20 MED ORDER — KETOROLAC TROMETHAMINE 30 MG/ML IJ SOLN
30.0000 mg | Freq: Four times a day (QID) | INTRAMUSCULAR | Status: AC | PRN
  Administered 2021-08-20: 30 mg via INTRAVENOUS

## 2021-08-20 MED ORDER — OXYTOCIN-SODIUM CHLORIDE 30-0.9 UT/500ML-% IV SOLN
INTRAVENOUS | Status: DC | PRN
  Administered 2021-08-20: 30 [IU] via INTRAVENOUS

## 2021-08-20 MED ORDER — OXYCODONE HCL 5 MG/5ML PO SOLN: 5.0000 mg | Freq: Once | ORAL | Status: DC | PRN

## 2021-08-20 MED ORDER — PHENYLEPHRINE HCL-NACL 20-0.9 MG/250ML-% IV SOLN
INTRAVENOUS | Status: DC | PRN
  Administered 2021-08-20: 60 ug/min via INTRAVENOUS

## 2021-08-20 MED ORDER — FUROSEMIDE 20 MG PO TABS
20.0000 mg | ORAL_TABLET | Freq: Every day | ORAL | Status: DC
  Administered 2021-08-21 – 2021-08-22 (×2): 20 mg via ORAL
  Filled 2021-08-20 (×2): qty 1

## 2021-08-20 MED ORDER — KETOROLAC TROMETHAMINE 30 MG/ML IJ SOLN
30.0000 mg | Freq: Four times a day (QID) | INTRAMUSCULAR | Status: AC
  Administered 2021-08-20 – 2021-08-21 (×3): 30 mg via INTRAVENOUS
  Filled 2021-08-20 (×3): qty 1

## 2021-08-20 MED ORDER — METOCLOPRAMIDE HCL 5 MG/ML IJ SOLN
10.0000 mg | Freq: Once | INTRAMUSCULAR | Status: DC | PRN
Start: 2021-08-20 — End: 2021-08-20

## 2021-08-20 MED ORDER — LABETALOL HCL 100 MG PO TABS
100.0000 mg | ORAL_TABLET | Freq: Two times a day (BID) | ORAL | Status: DC
  Administered 2021-08-20 – 2021-08-21 (×2): 100 mg via ORAL
  Filled 2021-08-20 (×2): qty 1

## 2021-08-20 MED ORDER — LACTATED RINGERS IV SOLN: INTRAVENOUS | Status: DC

## 2021-08-20 MED ORDER — DIPHENHYDRAMINE HCL 25 MG PO CAPS: 25.0000 mg | ORAL_CAPSULE | Freq: Four times a day (QID) | ORAL | Status: DC | PRN

## 2021-08-20 MED ORDER — SCOPOLAMINE 1 MG/3DAYS TD PT72
MEDICATED_PATCH | TRANSDERMAL | Status: AC
  Filled 2021-08-20: qty 1

## 2021-08-20 MED ORDER — OXYTOCIN-SODIUM CHLORIDE 30-0.9 UT/500ML-% IV SOLN: 2.5000 [IU]/h | INTRAVENOUS | Status: AC

## 2021-08-20 MED ORDER — SOD CITRATE-CITRIC ACID 500-334 MG/5ML PO SOLN: 30.0000 mL | ORAL | Status: DC

## 2021-08-20 MED ORDER — MORPHINE SULFATE (PF) 0.5 MG/ML IJ SOLN
INTRAMUSCULAR | Status: DC | PRN
  Administered 2021-08-20: 150 ug via INTRATHECAL

## 2021-08-20 MED ORDER — LACTATED RINGERS IV BOLUS: 1000.0000 mL | Freq: Once | INTRAVENOUS | Status: DC

## 2021-08-20 MED ORDER — DIBUCAINE (PERIANAL) 1 % EX OINT: 1.0000 "application " | TOPICAL_OINTMENT | CUTANEOUS | Status: DC | PRN

## 2021-08-20 MED ORDER — MORPHINE SULFATE (PF) 0.5 MG/ML IJ SOLN
INTRAMUSCULAR | Status: AC
  Filled 2021-08-20: qty 10

## 2021-08-20 MED ORDER — OXYTOCIN-SODIUM CHLORIDE 30-0.9 UT/500ML-% IV SOLN
INTRAVENOUS | Status: AC
  Filled 2021-08-20: qty 500

## 2021-08-20 MED ORDER — ONDANSETRON HCL 4 MG/2ML IJ SOLN
INTRAMUSCULAR | Status: DC | PRN
  Administered 2021-08-20: 4 mg via INTRAVENOUS

## 2021-08-20 MED ORDER — SIMETHICONE 80 MG PO CHEW: 80.0000 mg | CHEWABLE_TABLET | ORAL | Status: DC | PRN

## 2021-08-20 MED ORDER — FENTANYL CITRATE (PF) 100 MCG/2ML IJ SOLN: 25.0000 ug | INTRAMUSCULAR | Status: DC | PRN

## 2021-08-20 MED ORDER — PHENYLEPHRINE HCL-NACL 20-0.9 MG/250ML-% IV SOLN
INTRAVENOUS | Status: AC
  Filled 2021-08-20: qty 250

## 2021-08-20 MED ORDER — ACETAMINOPHEN 10 MG/ML IV SOLN
INTRAVENOUS | Status: AC
  Filled 2021-08-20: qty 100

## 2021-08-20 MED ORDER — MENTHOL 3 MG MT LOZG
1.0000 | LOZENGE | OROMUCOSAL | Status: DC | PRN
Start: 2021-08-20 — End: 2021-08-22

## 2021-08-20 MED ORDER — CEFAZOLIN SODIUM-DEXTROSE 2-4 GM/100ML-% IV SOLN
2.0000 g | INTRAVENOUS | Status: AC
  Administered 2021-08-20: 2 g via INTRAVENOUS

## 2021-08-20 MED ORDER — ACETAMINOPHEN 10 MG/ML IV SOLN
INTRAVENOUS | Status: DC | PRN
  Administered 2021-08-20: 1000 mg via INTRAVENOUS

## 2021-08-20 MED ORDER — ENOXAPARIN SODIUM 40 MG/0.4ML IJ SOSY
40.0000 mg | PREFILLED_SYRINGE | INTRAMUSCULAR | Status: DC
  Administered 2021-08-21 – 2021-08-22 (×2): 40 mg via SUBCUTANEOUS
  Filled 2021-08-20 (×2): qty 0.4

## 2021-08-20 MED ORDER — TETANUS-DIPHTH-ACELL PERTUSSIS 5-2.5-18.5 LF-MCG/0.5 IM SUSY: 0.5000 mL | PREFILLED_SYRINGE | Freq: Once | INTRAMUSCULAR | Status: DC

## 2021-08-20 MED ORDER — TERBUTALINE SULFATE 1 MG/ML IJ SOLN
0.2500 mg | Freq: Once | INTRAMUSCULAR | Status: AC
Start: 2021-08-20 — End: 2021-08-20
  Administered 2021-08-20: 0.25 mg via SUBCUTANEOUS
  Filled 2021-08-20: qty 1

## 2021-08-20 MED ORDER — SCOPOLAMINE 1 MG/3DAYS TD PT72
1.0000 | MEDICATED_PATCH | Freq: Once | TRANSDERMAL | Status: DC
  Administered 2021-08-20: 1.5 mg via TRANSDERMAL

## 2021-08-20 MED ORDER — PRENATAL MULTIVITAMIN CH
1.0000 | ORAL_TABLET | Freq: Every day | ORAL | Status: DC
  Administered 2021-08-21 – 2021-08-22 (×2): 1 via ORAL
  Filled 2021-08-20 (×2): qty 1

## 2021-08-20 MED ORDER — DEXAMETHASONE SODIUM PHOSPHATE 10 MG/ML IJ SOLN
INTRAMUSCULAR | Status: DC | PRN
  Administered 2021-08-20: 10 mg via INTRAVENOUS

## 2021-08-20 MED ORDER — FENTANYL CITRATE (PF) 100 MCG/2ML IJ SOLN
INTRAMUSCULAR | Status: AC
  Filled 2021-08-20: qty 2

## 2021-08-20 MED ORDER — KETOROLAC TROMETHAMINE 30 MG/ML IJ SOLN: 30.0000 mg | Freq: Four times a day (QID) | INTRAMUSCULAR | Status: AC | PRN

## 2021-08-20 MED ORDER — IBUPROFEN 600 MG PO TABS
600.0000 mg | ORAL_TABLET | Freq: Four times a day (QID) | ORAL | Status: DC
  Administered 2021-08-21 – 2021-08-22 (×4): 600 mg via ORAL
  Filled 2021-08-20 (×4): qty 1

## 2021-08-20 MED ORDER — BUPIVACAINE IN DEXTROSE 0.75-8.25 % IT SOLN
INTRATHECAL | Status: DC | PRN
  Administered 2021-08-20: 1.6 mL via INTRATHECAL

## 2021-08-20 MED ORDER — LACTATED RINGERS IV SOLN: INTRAVENOUS | Status: DC | PRN

## 2021-08-20 MED ORDER — DEXAMETHASONE SODIUM PHOSPHATE 10 MG/ML IJ SOLN
INTRAMUSCULAR | Status: AC
  Filled 2021-08-20: qty 1

## 2021-08-20 MED ORDER — OXYCODONE HCL 5 MG PO TABS: 5.0000 mg | ORAL_TABLET | Freq: Once | ORAL | Status: DC | PRN

## 2021-08-20 MED ORDER — SIMETHICONE 80 MG PO CHEW
80.0000 mg | CHEWABLE_TABLET | Freq: Three times a day (TID) | ORAL | Status: DC
  Administered 2021-08-20 – 2021-08-22 (×5): 80 mg via ORAL
  Filled 2021-08-20 (×5): qty 1

## 2021-08-20 MED ORDER — ACETAMINOPHEN 500 MG PO TABS
1000.0000 mg | ORAL_TABLET | Freq: Four times a day (QID) | ORAL | Status: DC
  Administered 2021-08-20 – 2021-08-22 (×7): 1000 mg via ORAL
  Filled 2021-08-20 (×7): qty 2

## 2021-08-20 MED ORDER — ONDANSETRON HCL 4 MG/2ML IJ SOLN
INTRAMUSCULAR | Status: AC
  Filled 2021-08-20: qty 2

## 2021-08-20 MED ORDER — KETOROLAC TROMETHAMINE 30 MG/ML IJ SOLN
INTRAMUSCULAR | Status: AC
  Filled 2021-08-20: qty 1

## 2021-08-20 MED ORDER — SENNOSIDES-DOCUSATE SODIUM 8.6-50 MG PO TABS
2.0000 | ORAL_TABLET | Freq: Every day | ORAL | Status: DC
  Administered 2021-08-21 – 2021-08-22 (×2): 2 via ORAL
  Filled 2021-08-20 (×2): qty 2

## 2021-08-20 MED ORDER — MEPERIDINE HCL 25 MG/ML IJ SOLN: 6.2500 mg | INTRAMUSCULAR | Status: DC | PRN

## 2021-08-20 MED ORDER — WITCH HAZEL-GLYCERIN EX PADS
1.0000 | MEDICATED_PAD | CUTANEOUS | Status: DC | PRN
Start: 2021-08-20 — End: 2021-08-22

## 2021-08-20 MED ORDER — COCONUT OIL OIL
1.0000 "application " | TOPICAL_OIL | Status: DC | PRN
  Administered 2021-08-21: 1 via TOPICAL

## 2021-08-20 MED ORDER — OXYCODONE HCL 5 MG PO TABS
5.0000 mg | ORAL_TABLET | ORAL | Status: DC | PRN
  Administered 2021-08-20 – 2021-08-22 (×4): 10 mg via ORAL
  Filled 2021-08-20 (×5): qty 2

## 2021-08-20 MED ORDER — SODIUM CHLORIDE 0.9 % IV SOLN
500.0000 mg | INTRAVENOUS | Status: AC
  Administered 2021-08-20: 500 mg via INTRAVENOUS
  Filled 2021-08-20: qty 5

## 2021-08-20 MED ORDER — FENTANYL CITRATE (PF) 100 MCG/2ML IJ SOLN
INTRAMUSCULAR | Status: DC | PRN
  Administered 2021-08-20: 15 ug via INTRATHECAL

## 2021-08-20 SURGICAL SUPPLY — 33 items
BENZOIN TINCTURE PRP APPL 2/3 (GAUZE/BANDAGES/DRESSINGS) ×2 IMPLANT
CANISTER SUCT 3000ML PPV (MISCELLANEOUS) ×2 IMPLANT
CHLORAPREP W/TINT 26ML (MISCELLANEOUS) ×4 IMPLANT
CLAMP UMBILICAL CORD (MISCELLANEOUS) ×2 IMPLANT
DRSG OPSITE POSTOP 4X10 (GAUZE/BANDAGES/DRESSINGS) ×2 IMPLANT
ELECT REM PT RETURN 9FT ADLT (ELECTROSURGICAL) ×2
ELECTRODE REM PT RTRN 9FT ADLT (ELECTROSURGICAL) ×1 IMPLANT
EXTRACTOR VACUUM KIWI (MISCELLANEOUS) ×2 IMPLANT
GLOVE BIOGEL PI IND STRL 7.0 (GLOVE) ×2 IMPLANT
GLOVE BIOGEL PI IND STRL 7.5 (GLOVE) ×1 IMPLANT
GLOVE BIOGEL PI INDICATOR 7.0 (GLOVE) ×2
GLOVE BIOGEL PI INDICATOR 7.5 (GLOVE) ×1
GLOVE SKINSENSE NS SZ7.0 (GLOVE) ×1
GLOVE SKINSENSE STRL SZ7.0 (GLOVE) ×1 IMPLANT
GOWN STRL REUS W/ TWL LRG LVL3 (GOWN DISPOSABLE) ×2 IMPLANT
GOWN STRL REUS W/ TWL XL LVL3 (GOWN DISPOSABLE) ×1 IMPLANT
GOWN STRL REUS W/TWL LRG LVL3 (GOWN DISPOSABLE) ×2
GOWN STRL REUS W/TWL XL LVL3 (GOWN DISPOSABLE) ×1
NS IRRIG 1000ML POUR BTL (IV SOLUTION) ×2 IMPLANT
PACK C SECTION WH (CUSTOM PROCEDURE TRAY) ×2 IMPLANT
PAD ABD 7.5X8 STRL (GAUZE/BANDAGES/DRESSINGS) ×2 IMPLANT
PAD OB MATERNITY 4.3X12.25 (PERSONAL CARE ITEMS) ×2 IMPLANT
PAD PREP 24X48 CUFFED NSTRL (MISCELLANEOUS) ×2 IMPLANT
STRIP CLOSURE SKIN 1/2X4 (GAUZE/BANDAGES/DRESSINGS) ×2 IMPLANT
SUT MNCRL 0 VIOLET CTX 36 (SUTURE) ×2 IMPLANT
SUT MON AB 4-0 PS1 27 (SUTURE) ×2 IMPLANT
SUT MONOCRYL 0 CTX 36 (SUTURE) ×2
SUT PLAIN 2 0 XLH (SUTURE) ×2 IMPLANT
SUT VIC AB 0 CT1 36 (SUTURE) ×4 IMPLANT
SUT VIC AB 3-0 CT1 27 (SUTURE) ×1
SUT VIC AB 3-0 CT1 TAPERPNT 27 (SUTURE) ×1 IMPLANT
TOWEL OR 17X24 6PK STRL BLUE (TOWEL DISPOSABLE) ×4 IMPLANT
WATER STERILE IRR 1000ML POUR (IV SOLUTION) ×2 IMPLANT

## 2021-08-20 NOTE — Anesthesia Preprocedure Evaluation (Addendum)
Anesthesia Evaluation  Patient identified by MRN, date of birth, ID band Patient awake    Reviewed: Allergy & Precautions, NPO status , Patient's Chart, lab work & pertinent test results, reviewed documented beta blocker date and time   History of Anesthesia Complications Negative for: history of anesthetic complications  Airway Mallampati: III  TM Distance: >3 FB Neck ROM: Full    Dental  (+) Dental Advisory Given, Teeth Intact   Pulmonary Current Smoker and Patient abstained from smoking.,    Pulmonary exam normal        Cardiovascular hypertension (chronic), Pt. on medications and Pt. on home beta blockers Normal cardiovascular exam     Neuro/Psych PSYCHIATRIC DISORDERS Anxiety Depression Bipolar Disorder negative neurological ROS     GI/Hepatic Neg liver ROS, GERD  Controlled,  Endo/Other   gDM with prior pregnancy   Renal/GU negative Renal ROS     Musculoskeletal negative musculoskeletal ROS (+)   Abdominal   Peds  Hematology negative hematology ROS (+)   Anesthesia Other Findings   Reproductive/Obstetrics (+) Pregnancy  Hx Pre-Eclampsia                             Anesthesia Physical Anesthesia Plan  ASA: 2 and emergent  Anesthesia Plan: Spinal   Post-op Pain Management: Regional block*   Induction:   PONV Risk Score and Plan: 2 and Treatment may vary due to age or medical condition, Ondansetron and Scopolamine patch - Pre-op  Airway Management Planned: Natural Airway  Additional Equipment: None  Intra-op Plan:   Post-operative Plan:   Informed Consent: I have reviewed the patients History and Physical, chart, labs and discussed the procedure including the risks, benefits and alternatives for the proposed anesthesia with the patient or authorized representative who has indicated his/her understanding and acceptance.       Plan Discussed with: CRNA,  Anesthesiologist and Surgeon  Anesthesia Plan Comments: (Patient had 2 burritos this AM, not yet NPO. Discussed with surgeon who felt this procedure could not be delayed, deemed emergent in nature. Labs reviewed, platelets acceptable. Discussed risks and benefits of spinal, including spinal/epidural hematoma, infection, failed block, and PDPH. Patient expressed understanding and wished to proceed. )       Anesthesia Quick Evaluation

## 2021-08-20 NOTE — OB Triage Provider Note (Signed)
S: [name] is a 46 y.o. G2P2002 at [redacted]w[redacted]d who presents to MAU today complaining contractions q 5 minutes since 0900. She denies vaginal bleeding. She denies LOF. She reports normal fetal movement.    O: BP 129/84   Pulse 89   Temp 99 F (37.2 C) (Oral)   Resp 17   LMP 11/10/2020 (Exact Date)   SpO2 98%   Breastfeeding Unknown  GENERAL: Well-developed, well-nourished female in no acute distress.  HEAD: Normocephalic, atraumatic.  CHEST: Normal effort of breathing, regular heart rate ABDOMEN: Soft, nontender, gravid  Cervical exam:  Dilation: 3 Cervical Position: Middle Presentation: Single Footling Breech Exam by:: J.Lashell Moffitt, cnm   Fetal Monitoring: Category 1 fetal tracing.    A: SIUP at 328w1dActive labor  P:  Discussed patient with Dr. PiIlda Basset Patient is planned C/s for Breech presentation.  Dr. PiIlda Basseto come to MAU to consent for C/s  Dasiah Hooley, JeArtist PaisNP 08/20/2021 4:12 PM

## 2021-08-20 NOTE — Lactation Note (Signed)
This note was copied from a baby's chart. Lactation Consultation Note  Patient Name: Catherine [name] KPQAE'S Date: 08/20/2021   Age:44 hours  Lactation acknowledges new delivery. I have spoken with RN, Catherine [name], and front desk staff in [place], and have asked if mom could have assistance with breast pumping this evening.   Catherine [name] 08/20/2021, 4:29 PM

## 2021-08-20 NOTE — Transfer of Care (Signed)
Immediate Anesthesia Transfer of Care Note  Patient: Catherine [name]  Procedure(s) Performed: CESAREAN SECTION  Patient Location: PACU  Anesthesia Type:Spinal  Level of Consciousness: awake, alert  and oriented  Airway & Oxygen Therapy: Patient Spontanous Breathing  Post-op Assessment: Report given to RN and Post -op Vital signs reviewed and stable  Post vital signs: Reviewed and stable  Last Vitals:  Vitals Value Taken Time  BP 100/61 08/20/21 1645  Temp 36.2 C 08/20/21 1639  Pulse 74 08/20/21 1653  Resp 16 08/20/21 1653  SpO2 96 % 08/20/21 1653  Vitals shown include unvalidated device data.  Last Pain:  Vitals:   08/20/21 1645  TempSrc:   PainSc: 0-No pain      Patients Stated Pain Goal: 0 (15/94/70 7615)  Complications: No notable events documented.

## 2021-08-20 NOTE — Anesthesia Procedure Notes (Signed)
Spinal  Patient location during procedure: OR Start time: 08/20/2021 3:27 PM End time: 08/20/2021 3:30 PM Reason for block: surgical anesthesia Staffing Performed: anesthesiologist  Anesthesiologist: Audry Pili, MD Performed by: Audry Pili, MD Authorized by: Audry Pili, MD   Preanesthetic Checklist Completed: patient identified, IV checked, risks and benefits discussed, surgical consent, monitors and equipment checked, pre-op evaluation and timeout performed Spinal Block Patient position: sitting Prep: DuraPrep Patient monitoring: heart rate, cardiac monitor, continuous pulse ox and blood pressure Approach: midline Location: L2-3 Injection technique: single-shot Needle Needle type: Pencan  Needle gauge: 24 G Additional Notes Consent was obtained prior to the procedure with all questions answered and concerns addressed. Risks including, but not limited to, bleeding, infection, nerve damage, paralysis, failed block, inadequate analgesia, allergic reaction, high spinal, itching, and headache were discussed and the patient wished to proceed. Functioning IV was confirmed and monitors were applied. Sterile prep and drape, including hand hygiene, mask, and sterile gloves were used. The patient was positioned and the spine was prepped. The skin was anesthetized with lidocaine. Free flow of clear CSF was obtained prior to injecting local anesthetic into the CSF. The spinal needle aspirated freely following injection. The needle was carefully withdrawn. The patient tolerated the procedure well.   Renold Don, MD

## 2021-08-20 NOTE — Discharge Summary (Signed)
Postpartum Discharge Summary  Date of Service updated***     Patient Name: Catherine [name] DOB: 1976/01/16 MRN: [id number]  Date of admission: 08/20/2021 Delivery date:08/20/2021  Delivering provider: Aletha Halim  Date of discharge: 08/20/2021  Admitting diagnosis: History of cesarean delivery [Z98.891] Intrauterine pregnancy: [redacted]w[redacted]d    Secondary diagnosis:  Principal Problem:   Status post repeat low transverse cesarean section Active Problems:   Supervision of high risk pregnancy, antepartum   Known fetal anomaly, antepartum   Prenatal care insufficient, third trimester   Chronic hypertension in obstetric context in third trimester   Advanced maternal age in multigravida, third trimester   Breech presentation   History of cesarean delivery  Additional problems: ***    Discharge diagnosis: Term Pregnancy Delivered                                              Post partum procedures:{Postpartum procedures:23558} Augmentation: N/A Complications: None  Hospital course: Sceduled C/S   46y.o. yo G2P2002 at 44w1das admitted to the hospital 08/20/2021 for scheduled cesarean section with the following indication:  Hx of CS and breech presentation.  Delivery details are as follows:  Membrane Rupture Time/Date: 3:51 PM ,08/20/2021   Delivery Method:C-Section, Low Transverse  Details of operation can be found in separate operative note.  Patient had an uncomplicated postpartum course.  Her hemoglobin on POD#1 was ***, for which she received ***.  Her BP was ***.  She was started on Lasix postpartum, which she will continue upon discharge to complete a 5 day course***.  She is ambulating, tolerating a regular diet, passing flatus, and urinating well.  Her pain and bleeding are controlled.  Patient is discharged home in stable condition on  08/20/21        Newborn Data: Birth date:08/20/2021  Birth time:3:53 PM  Gender:Female  Living status:Living  Apgars: ,  Weight:3550 g      Magnesium Sulfate received: No BMZ received: No Rhophylac: N/A MMR: N/A T-DaP: Given prenatally Flu: No Transfusion: {Transfusion received:30440034}  Physical exam  Vitals:   08/20/21 1313 08/20/21 1332 08/20/21 1335  BP: 134/87 129/84   Pulse: 84 89   Resp: 17    Temp: 99 F (37.2 C)    TempSrc: Oral    SpO2: 96%  98%   General: {Exam; general:21111117} Lochia: {Desc; appropriate/inappropriate:30686::"appropriate"} Uterine Fundus: {Desc; firm/soft:30687} Incision: {Exam; incision:21111123} DVT Evaluation: {Exam; dvt:2111122}  Labs: Lab Results  Component Value Date   WBC 14.8 (H) 08/20/2021   HGB 9.1 (L) 08/20/2021   HCT 28.7 (L) 08/20/2021   MCV 87.8 08/20/2021   PLT 182 08/20/2021      Latest Ref Rng & Units 09/14/2020    4:18 PM  CMP  Glucose 65 - 99 mg/dL 78   BUN 6 - 24 mg/dL 9   Creatinine 0.57 - 1.00 mg/dL 0.79   Sodium 134 - 144 mmol/L 141   Potassium 3.5 - 5.2 mmol/L 4.1   Chloride 96 - 106 mmol/L 102   CO2 20 - 29 mmol/L 25   Calcium 8.7 - 10.2 mg/dL 10.0   Total Protein 6.0 - 8.5 g/dL 6.8   Total Bilirubin 0.0 - 1.2 mg/dL 0.2   Alkaline Phos 44 - 121 IU/L 79   AST 0 - 40 IU/L 14   ALT 0 - 32 IU/L 12  Edinburgh Score:     No data to display           After visit meds:  Allergies as of 08/20/2021   No Known Allergies   Med Rec must be completed prior to using this SMARTLINK***        Discharge home in stable condition Infant Feeding: {Baby feeding:23562} Infant Disposition:{CHL IP OB HOME WITH MOTHER:23581} Discharge instruction: per After Visit Summary and Postpartum booklet. Activity: Advance as tolerated. Pelvic rest for 6 weeks.  Diet: routine diet Future Appointments: Future Appointments  Date Time Provider Department Center  08/23/2021  3:30 PM [facility] NURSE [facility]  08/23/2021  3:45 PM [facility] US6 [facility]  08/24/2021  9:35 AM Arnold, James G, MD [facility] None  08/24/2021 10:00 AM [facility] PAT 1 [facility] None   08/31/2021  8:35 AM Bass, Lawrence A, MD [facility] None   Follow up Visit: Message sent to [facility] by Dr. Albert on 08/20/21.   Please schedule this patient for a In person postpartum visit in 6 weeks with the following provider: Any provider. Additional Postpartum F/U: Incision check 1 week and BP check 1 week  High risk pregnancy complicated by: cHTN, history of CS, fetal anomalies, AMA, high risk for T21, breech presentation  Delivery mode:  C-Section, Low Transverse  Anticipated Birth Control:  OCPs  08/20/2021 Christina M Albert, MD    

## 2021-08-20 NOTE — MAU Note (Signed)
Pt reports to mau with c/o ctx q 5 min since 0900 this morning.  Pt denies LOF. +FM Reports some mucous like dc

## 2021-08-20 NOTE — H&P (Signed)
Obstetrics Admission History & Physical  08/20/2021 - 2:24 PM Primary OBGYN: [facility] (no prenatal care from 1/23 until 5/30)  Chief Complaint: early labor  History of Present Illness  46 y.o. G2P1001 @ [redacted]w[redacted]d with the above CC. Pregnancy complicated by: AMA, high risk for T21 on cffdna and with multiple fetal anomalies on u/s, cHTN on meds, h/o prior c-section  Ms. RAmeliarose Sharkstates that last PO at 0800 today.   Review of Systems: as noted in the History of Present Illness.  Patient Active Problem List   Diagnosis Date Noted   Advanced maternal age in multigravida, third trimester 08/10/2021   Breech presentation 08/10/2021   Prenatal care insufficient, third trimester 08/02/2021   Chronic hypertension in obstetric context in third trimester 08/02/2021   Known fetal anomaly, antepartum 04/13/2021   Supervision of high risk pregnancy, antepartum 02/01/2021   Moderate episode of recurrent major depressive disorder ([place] 01/05/2019   Essential hypertension 01/05/2019   PTSD (post-traumatic stress disorder) 03/18/2017     PMHx:  Past Medical History:  Diagnosis Date   Advanced maternal age (AMA) in pregnancy    Anxiety 2017   Bipolar disorder ([place] 2017   Chronic hypertension    Depression 2017   GERD (gastroesophageal reflux disease) 11/03/2012   Gestational diabetes    diet controlled   Gestational hypertension    Late prenatal care    Medical history non-contributory    Preeclampsia    PSHx:  Past Surgical History:  Procedure Laterality Date   CESAREAN SECTION N/A 12/21/2012   Procedure: CESAREAN SECTION;  Surgeon: LFlorian Buff MD;  [facility];  Service: Obstetrics;  Laterality: N/A;   LAPAROSCOPIC OVARIAN  2004   Medications:  Medications Prior to Admission  Medication Sig Dispense Refill Last Dose   aspirin EC 81 MG tablet Take 1 tablet (81 mg total) by mouth daily. Take after 12 weeks for prevention of preeclampsia later in pregnancy (Patient not  taking: Reported on 08/02/2021) 300 tablet 2    Blood Pressure Monitoring (BLOOD PRESSURE KIT) DEVI 1 kit by Does not apply route once a week. 1 each 0    labetalol (NORMODYNE) 100 MG tablet TAKE 1 TABLET(100 MG) BY MOUTH TWICE DAILY 60 tablet 3    Misc. Devices (GOJJI WEIGHT SCALE) MISC 1 Device by Does not apply route every 30 (thirty) days. (Patient not taking: Reported on 08/08/2021) 1 each 0    Prenatal Vit-Fe Sulfate-FA-DHA (PRENATAL VITAMIN/MIN +DHA) 27-0.8-200 MG CAPS Take by mouth.        Allergies: has No Known Allergies. OBHx:  OB History  Gravida Para Term Preterm AB Living  2 1 1  0 0 1  SAB IAB Ectopic Multiple Live Births  0 0 0 0 1    # Outcome Date GA Lbr Len/2nd Weight Sex Delivery Anes PTL Lv  2 Current           1 Term 12/21/12 379w2d1:19 / 05:39 3905 g F CS-LTranv EPI  LIV         FHx:  Family History  Problem Relation Age of Onset   Hypertension Mother    Aneurysm Mother    Hypertension Father    Hypertension Paternal Aunt    Alcohol abuse Neg Hx    Arthritis Neg Hx    Asthma Neg Hx    Birth defects Neg Hx    Cancer Neg Hx    COPD Neg Hx    Depression Neg Hx    Diabetes  Neg Hx    Drug abuse Neg Hx    Early death Neg Hx    Hearing loss Neg Hx    Heart disease Neg Hx    Hyperlipidemia Neg Hx    Kidney disease Neg Hx    Learning disabilities Neg Hx    Mental illness Neg Hx    Mental retardation Neg Hx    Miscarriages / Stillbirths Neg Hx    Stroke Neg Hx    Vision loss Neg Hx    Soc Hx:  Social History   Socioeconomic History   Marital status: Single    Spouse name: Not on file   Number of children: 1   Years of education: Not on file   Highest education level: Not on file  Occupational History   Not on file  Tobacco Use   Smoking status: Every Day    Types: Cigarettes   Smokeless tobacco: Never  Vaping Use   Vaping Use: Never used  Substance and Sexual Activity   Alcohol use: No   Drug use: No   Sexual activity: Yes    Partners:  Male    Birth control/protection: None  Other Topics Concern   Not on file  Social History Narrative   Not on file   Social Determinants of Health   Financial Resource Strain: Not on file  Food Insecurity: Not on file  Transportation Needs: Not on file  Physical Activity: Not on file  Stress: Not on file  Social Connections: Not on file  Intimate Partner Violence: Not on file    Objective    Current Vital Signs 24h Vital Sign Ranges  T 99 F (37.2 C) Temp  Avg: 99 F (37.2 C)  Min: 99 F (37.2 C)  Max: 99 F (37.2 C)  BP 129/84 BP  Min: 129/84  Max: 134/87  HR 89 Pulse  Avg: 86.5  Min: 84  Max: 89  RR 17 Resp  Avg: 17  Min: 17  Max: 17  SaO2 98 %   SpO2  Avg: 97 %  Min: 96 %  Max: 98 %       24 Hour I/O Current Shift I/O  Time Ins Outs No intake/output data recorded. No intake/output data recorded.   EFM: 130 baseline, +accels, no decel, mod variablity  Toco: q2-57m General: Well nourished, well developed female in no acute distress.  Skin:  Warm and dry.  Cardiovascular: S1, S2 normal, no murmur, rub or gallop, regular rate and rhythm Respiratory:  Clear to auscultation bilateral. Normal respiratory effort Abdomen: gravid, nttp Neuro/Psych:  Normal mood and affect.   SVE: 3/90 per NP, breech with foot in vagina  Labs  pending Radiology No new imaging  Assessment & Plan   46y.o. G2P1001 @ 32w1dith early labor, prior c-section, pt stable *Pregnancy: category I *Early labor: d/w her and recommend c/s which she is amenable to after d/w her re: r/b/a. D/w anesthesia and will proceed once labs are back *Fetal anomalies: I d/w her that baby may need to be transferred after delivery. D/w nicu and anomalies seen and lack of prenatal care, echoes, etc *cHTN: no issues *Repeat c-section: pt declines btl. She would like OCPs. I told her that she may not be a candidate for OCPs; she still declines btl.  GBS: neg  ChDurene RomansD Attending Center for  [facility]

## 2021-08-20 NOTE — Op Note (Signed)
Catherine [name]  PROCEDURE DATE: 08/20/2021  PREOPERATIVE DIAGNOSES: Intrauterine pregnancy at 82w1dweeks gestation; history of cesarean section; breech presentation; normal labor; AMA; chronic hypertension; fetal anomalies, HR NIPS for trisomy 21  POSTOPERATIVE DIAGNOSES: The same  PROCEDURE: Repeat Low Transverse Cesarean Section  SURGEON:  Dr. CAletha Halim  ASSISTANT:  Dr. CVilma [name]  An experienced assistant was required given the standard of surgical care given the complexity of the case.  This assistant was needed for exposure, dissection, suctioning, retraction, instrument exchange, assisting with delivery with administration of fundal pressure, and for overall help during the procedure.  ANESTHESIOLOGY TEAM: Anesthesiologist: Catherine Pili MD CRNA: Catherine Blade CRNA  INDICATIONS: Catherine [name] a 46y.o. [id number] for cesarean section secondary to the indications listed under preoperative diagnoses; please see preoperative note for further details.  The risks of cesarean section were discussed with the patient including but were not limited to: bleeding which may require transfusion or reoperation; infection which may require antibiotics; injury to bowel, bladder, ureters or other surrounding organs; injury to the fetus; need for additional procedures including hysterectomy in the event of a life-threatening hemorrhage; placental abnormalities wth subsequent pregnancies, incisional problems, thromboembolic phenomenon and other postoperative/anesthesia complications.   The patient concurred with the proposed plan, giving informed written consent for the procedure.    FINDINGS:  Viable female infant in complete breech presentation.  Apgars pending.  Clear amniotic fluid.  Intact placenta, three vessel cord.  Normal uterus.  Minimal overall adhesive disease.    ANESTHESIA: Spinal  INTRAVENOUS FLUIDS: 1000 ml   ESTIMATED BLOOD LOSS: 495  ml URINE OUTPUT:  150 ml SPECIMENS: Placenta sent to pathology COMPLICATIONS: None immediate  PROCEDURE IN DETAIL:   The patient preoperatively received intravenous antibiotics and had sequential compression devices applied to her lower extremities.  She was then taken to the operating room where spinal anesthesia was administered and was found to be adequate.  She was then placed in a dorsal supine position with a leftward tilt, and prepped and draped in a sterile manner.  A foley catheter was placed into her bladder and attached to constant gravity.    After an adequate timeout was performed, a Pfannenstiel skin incision was made with a scalpel just above her preexisting scar and carried through to the underlying layer of fascia. The fascia was incised in the midline, and this incision was extended bilaterally bluntly and using the Mayo scissors.  The rectus muscles were separated in the midline and the peritoneum was entered bluntly. The Alexis self-retaining retractor was introduced into the abdominal cavity.    Attention was turned to the lower uterine segment where a low transverse hysterotomy was made with a scalpel and extended bilaterally bluntly.  Infant found to be in complete breech presentation.  The left leg was delivered, followed by the right leg.  The back was rotated anteriorly and delivered up to bilateral shoulders.  The head was then flexed, allowing for successful delivery.  The cord was clamped and cut immediately and the infant was handed over to the awaiting neonatology team.  Cord blood and cord gases drawn.  Uterine massage was then administered, and the placenta delivered intact with a three-vessel cord. Irregular appearance to the placenta was noted.  The uterus was then cleared of clots and debris.    The hysterotomy was closed with 0 Monocryl in a running locked fashion.  The pelvis was cleared of all clot and debris.  Hemostasis was confirmed on all surfaces.  The  retractor was removed.    The peritoneum and rectus muscles were re-approximated with a 3-0 Vicryl running stitch.  The fascia was then closed using 0 Vicryl in a running fashion.  The subcutaneous layer was irrigated, re-approximated with 2-0 plain gut, and the skin was closed with a 4-0 Vicryl subcuticular stitch.  The patient tolerated the procedure well.  Sponge, instrument and needle counts were correct x 3.  She was taken to the recovery room in stable condition.   Catherine Meckel, MD  OB Fellow  Faculty Practice

## 2021-08-20 NOTE — Anesthesia Postprocedure Evaluation (Signed)
Anesthesia Post Note  Patient: Catherine [name]  Procedure(s) Performed: [place]     Patient location during evaluation: PACU Anesthesia Type: Spinal Level of consciousness: awake and alert Pain management: pain level controlled Vital Signs Assessment: post-procedure vital signs reviewed and stable Respiratory status: spontaneous breathing and respiratory function stable Cardiovascular status: blood pressure returned to baseline and stable Postop Assessment: spinal receding and no apparent nausea or vomiting Anesthetic complications: no   No notable events documented.  Last Vitals:  Vitals:   08/20/21 1715 08/20/21 1718  BP: 123/75   Pulse: 73 78  Resp: 16 18  Temp:    SpO2: 97% 97%    Last Pain:  Vitals:   08/20/21 1715  TempSrc:   PainSc: 0-No pain   Pain Goal: Patients Stated Pain Goal: 0 (08/20/21 1315)  LLE Motor Response: Purposeful movement (08/20/21 1715)   RLE Motor Response: Purposeful movement (08/20/21 1715)       Epidural/Spinal Function Cutaneous sensation: Pins and Needles (08/20/21 1715), Patient able to flex knees: Yes (08/20/21 1715), Patient able to lift hips off bed: No (08/20/21 1715), Back pain beyond tenderness at insertion site: No (08/20/21 1715), Progressively worsening motor and/or sensory loss: No (08/20/21 1715), Bowel and/or bladder incontinence post epidural: No (08/20/21 1715)  Audry Pili

## 2021-08-21 ENCOUNTER — Encounter (HOSPITAL_COMMUNITY): Payer: Self-pay | Admitting: Obstetrics and Gynecology

## 2021-08-21 DIAGNOSIS — D62 Acute posthemorrhagic anemia: Secondary | ICD-10-CM

## 2021-08-21 LAB — CBC
HCT: 24.8 % — ABNORMAL LOW (ref 36.0–46.0)
Hemoglobin: 7.7 g/dL — ABNORMAL LOW (ref 12.0–15.0)
MCH: 27.2 pg (ref 26.0–34.0)
MCHC: 31 g/dL (ref 30.0–36.0)
MCV: 87.6 fL (ref 80.0–100.0)
Platelets: 160 10*3/uL (ref 150–400)
RBC: 2.83 MIL/uL — ABNORMAL LOW (ref 3.87–5.11)
RDW: 15.2 % (ref 11.5–15.5)
WBC: 20.2 10*3/uL — ABNORMAL HIGH (ref 4.0–10.5)
nRBC: 0 % (ref 0.0–0.2)

## 2021-08-21 LAB — RPR: RPR Ser Ql: NONREACTIVE

## 2021-08-21 LAB — CREATININE, SERUM
Creatinine, Ser: 0.64 mg/dL (ref 0.44–1.00)
GFR, Estimated: >60 mL/min (ref >60)

## 2021-08-21 MED ORDER — SODIUM CHLORIDE 0.9 % IV SOLN
500.0000 mg | Freq: Once | INTRAVENOUS | Status: AC
  Administered 2021-08-21: 500 mg via INTRAVENOUS
  Filled 2021-08-21: qty 25

## 2021-08-21 MED ORDER — LABETALOL HCL 200 MG PO TABS
200.0000 mg | ORAL_TABLET | Freq: Two times a day (BID) | ORAL | Status: DC
  Administered 2021-08-21 – 2021-08-22 (×2): 200 mg via ORAL
  Filled 2021-08-21 (×2): qty 1

## 2021-08-21 NOTE — Progress Notes (Signed)
POSTPARTUM PROGRESS NOTE  POD #1  Subjective:  Catherine [name] is a 46 y.o. G2P2002 s/p rLTCS at [redacted]w[redacted]d Today she notes no acute complaints. She denies any problems with ambulating or po intake. Denies nausea or vomiting. She has passed flatus, no BM.  Pain is well controlled.  Lochia minimal.  Foley in place Denies fever/chills/chest pain/SOB.  no HA, no blurry vision, no RUQ pain  Objective: Blood pressure 115/65, pulse 79, temperature 98.7 F (37.1 C), temperature source Oral, resp. rate 18, last menstrual period 11/10/2020, SpO2 97 %, unknown if currently breastfeeding.  Physical Exam:  General: alert, cooperative and no distress Chest: no respiratory distress Heart: regular rate and rhythm Abdomen: soft, nontender, +BS Uterine Fundus: firm, appropriately tender Incision: C/D/I with honeycomb DVT Evaluation: No calf swelling or tenderness Extremities: no edema Skin: warm, dry  Results for orders placed or performed during the hospital encounter of 08/20/21 (from the past 24 hour(s))  Urinalysis, Routine w reflex microscopic Urine, Clean Catch     Status: Abnormal   Collection Time: 08/20/21  1:17 PM  Result Value Ref Range   Color, Urine YELLOW YELLOW   APPearance HAZY (A) CLEAR   Specific Gravity, Urine 1.019 1.005 - 1.030   pH 6.0 5.0 - 8.0   Glucose, UA NEGATIVE NEGATIVE mg/dL   Hgb urine dipstick NEGATIVE NEGATIVE   Bilirubin Urine NEGATIVE NEGATIVE   Ketones, ur NEGATIVE NEGATIVE mg/dL   Protein, ur 30 (A) NEGATIVE mg/dL   Nitrite NEGATIVE NEGATIVE   Leukocytes,Ua SMALL (A) NEGATIVE   RBC / HPF 0-5 0 - 5 RBC/hpf   WBC, UA 21-50 0 - 5 WBC/hpf   Bacteria, UA MANY (A) NONE SEEN   Squamous Epithelial / LPF 0-5 0 - 5   Mucus PRESENT   CBC with Differential/Platelet     Status: Abnormal   Collection Time: 08/20/21  2:06 PM  Result Value Ref Range   WBC 14.8 (H) 4.0 - 10.5 K/uL   RBC 3.27 (L) 3.87 - 5.11 MIL/uL   Hemoglobin 9.1 (L) 12.0 - 15.0 g/dL   HCT 28.7 (L)  36.0 - 46.0 %   MCV 87.8 80.0 - 100.0 fL   MCH 27.8 26.0 - 34.0 pg   MCHC 31.7 30.0 - 36.0 g/dL   RDW 15.2 11.5 - 15.5 %   Platelets 182 150 - 400 K/uL   nRBC 0.0 0.0 - 0.2 %   Neutrophils Relative % 75 %   Neutro Abs 11.4 (H) 1.7 - 7.7 K/uL   Lymphocytes Relative 12 %   Lymphs Abs 1.7 0.7 - 4.0 K/uL   Monocytes Relative 9 %   Monocytes Absolute 1.3 (H) 0.1 - 1.0 K/uL   Eosinophils Relative 2 %   Eosinophils Absolute 0.3 0.0 - 0.5 K/uL   Basophils Relative 1 %   Basophils Absolute 0.1 0.0 - 0.1 K/uL   Immature Granulocytes 1 %   Abs Immature Granulocytes 0.13 (H) 0.00 - 0.07 K/uL  RPR     Status: None   Collection Time: 08/20/21  2:06 PM  Result Value Ref Range   RPR Ser Ql NON REACTIVE NON REACTIVE  Hemoglobin A1c     Status: None   Collection Time: 08/20/21  2:06 PM  Result Value Ref Range   Hgb A1c MFr Bld 5.5 4.8 - 5.6 %   Mean Plasma Glucose 111.15 mg/dL  Type and screen [place]    Status: None   Collection Time: 08/20/21  2:25 PM  Result Value Ref Range   ABO/RH(D) O POS    Antibody Screen NEG    Sample Expiration      08/23/2021,2359 Performed at [facility], [place] [address], [place], [place] [address]   ABO/Rh     Status: None   Collection Time: 08/20/21  2:47 PM  Result Value Ref Range   ABO/RH(D)      O POS Performed at [place] [address] [place],  [address]   CBC     Status: Abnormal   Collection Time: 08/21/21  4:50 AM  Result Value Ref Range   WBC 20.2 (H) 4.0 - 10.5 K/uL   RBC 2.83 (L) 3.87 - 5.11 MIL/uL   Hemoglobin 7.7 (L) 12.0 - 15.0 g/dL   HCT 24.8 (L) 36.0 - 46.0 %   MCV 87.6 80.0 - 100.0 fL   MCH 27.2 26.0 - 34.0 pg   MCHC 31.0 30.0 - 36.0 g/dL   RDW 15.2 11.5 - 15.5 %   Platelets 160 150 - 400 K/uL   nRBC 0.0 0.0 - 0.2 %  Creatinine, serum     Status: None   Collection Time: 08/21/21  4:50 AM  Result Value Ref Range   Creatinine, Ser 0.64 0.44 - 1.00 mg/dL   GFR, Estimated >60 >60  mL/min    Assessment/Plan: Catherine [name] is a 46 y.o. G2P2002 s/p rLTCS at 107w1dPOD#1 complicated by: 1) cHTN -on Labetalol '100mg'$  bid  -Lasix '20mg'$  x 5 days -BP now within normal limits  2) Postop care -pain well controlled -Lovenox for DVT prophylaxis -Hgb as above, currently asymptomatic, will discuss IV Venofer later today  Contraception: OCPs Feeding: breast  Dispo: Continue with routine postop care   LOS: 1 day   JJanyth Pupa DO Faculty Attending, [facility] [place]6/02/2022, 7:48 AM

## 2021-08-21 NOTE — Progress Notes (Signed)
Lab Addendum     Latest Ref Rng & Units 08/21/2021    4:50 AM 08/20/2021    2:06 PM 08/02/2021    8:58 AM  CBC  WBC 4.0 - 10.5 K/uL 20.2  14.8  9.7   Hemoglobin 12.0 - 15.0 g/dL 7.7  9.1  9.3   Hematocrit 36.0 - 46.0 % 24.8  28.7  28.0   Platelets 150 - 400 K/uL 160  182  183    Asymptomatic as per patient.  Discussed management of postoperative anemia due to operative blood loss with patient, Venofer recommended. She agreed with this plan. This was ordered for later tonight.   Verita Schneiders, MD, [place] for [employer], [place]

## 2021-08-21 NOTE — Lactation Note (Signed)
This note was copied from a baby's chart.  NICU Lactation Consultation Note  Patient Name: Catherine [name] [id number] Date: 08/21/2021 Age:44 hours   Subjective Reason for consult: Initial assessment Mother plans to breast and bottle feed. She participates in [facility] and plans to receive her pump through the [facility] program. [facility] faxed request.   Mother denies hx of breast surgery/trauma. She initiated pumping last night but has not pumped yet today. She plans to resume pumping p breakfast. We reviewed pumping recommendations and IDF when infant is ready.  Objective Infant data: Mother's Current Feeding Choice: Breast Milk   Maternal data: [id number]  C-Section, Low Transverse Previous breastfeeding challenges?: Breast / nipple pain  Does the patient have breastfeeding experience prior to this delivery?: Yes How long did the patient breastfeed?: 3 weeks  Pumping frequency: drops as expected  WIC Program: Yes WIC Referral Sent?: Yes  Assessment Infant: Feeding Status: NPO   Maternal: Milk volume: Normal   Intervention/Plan Interventions: Infant Driven Feeding Algorithm education; [employer] brochure; Education  Tools: Pump Pump Education: Setup, frequency, and cleaning; Milk Storage  Plan: Consult Status: NICU follow-up  NICU Follow-up type: New admission follow up; Verify onset of copious milk; Assist with IDF-1 (Mother to pre-pump before breastfeeding); Maternal D/C visit; Weekly NICU follow up; Verify absence of engorgement; Assist with IDF-2 (Mother does not need to pre-pump before breastfeeding)  Mother to pump q3h and bring any EBM to NICU.  Gwynne Edinger 08/21/2021, 9:18 AM

## 2021-08-22 ENCOUNTER — Other Ambulatory Visit (HOSPITAL_COMMUNITY): Payer: Self-pay

## 2021-08-22 LAB — SURGICAL PATHOLOGY

## 2021-08-22 MED ORDER — FUROSEMIDE 20 MG PO TABS
20.0000 mg | ORAL_TABLET | Freq: Two times a day (BID) | ORAL | 0 refills | Status: DC
  Filled 2021-08-22: qty 10, 5d supply, fill #0

## 2021-08-22 MED ORDER — SENNOSIDES-DOCUSATE SODIUM 8.6-50 MG PO TABS
2.0000 | ORAL_TABLET | Freq: Two times a day (BID) | ORAL | 0 refills | Status: AC | PRN
  Filled 2021-08-22: qty 30, 8d supply, fill #0

## 2021-08-22 MED ORDER — LABETALOL HCL 300 MG PO TABS
300.0000 mg | ORAL_TABLET | Freq: Two times a day (BID) | ORAL | 2 refills | Status: DC
  Filled 2021-08-22: qty 60, 30d supply, fill #0

## 2021-08-22 MED ORDER — OXYCODONE HCL 5 MG PO TABS
5.0000 mg | ORAL_TABLET | Freq: Four times a day (QID) | ORAL | 0 refills | Status: DC | PRN
  Filled 2021-08-22: qty 30, 7d supply, fill #0

## 2021-08-22 MED ORDER — IBUPROFEN 600 MG PO TABS
600.0000 mg | ORAL_TABLET | Freq: Four times a day (QID) | ORAL | 0 refills | Status: AC | PRN
Start: 2021-08-22 — End: ?
  Filled 2021-08-22: qty 60, 15d supply, fill #0

## 2021-08-22 NOTE — Lactation Note (Signed)
This note was copied from a baby's chart.  NICU Lactation Consultation Note  Patient Name: Catherine [name] [id number] Date: 08/22/2021 Age:44 hours   Subjective Reason for consult: Follow-up assessment Mother received a call from [facility] and was told to call her Medicaid provider for a pump. Mother states that Medicaid referred her back to [facility]. Mother was frustrated and said "I will just formula feed before I continue with this stress." This LC reached out to [facility] director for clarification and assistance.   Mother pumped last night and once so far this morning. She is yielding drops today as expected. She endorses + changes / breast fullness today.   Objective Infant data: Mother's Current Feeding Choice: Breast Milk  Infant feeding assessment Scale for Readiness: 4   Maternal data: G2P2002  C-Section, Low Transverse  Current breast feeding challenges:: infrequent pumping, AMA  Previous breastfeeding challenges?: Breast / nipple pain  Does the patient have breastfeeding experience prior to this delivery?: Yes How long did the patient breastfeed?: 3 weeks  Pumping frequency: drops today    WIC Program: Yes WIC Referral Sent?: Yes  Assessment Infant: Feeding Status: NPO  Maternal: Milk volume: Normal   Intervention/Plan Interventions: Education  Tools: Pump Pump Education: Setup, frequency, and cleaning; Milk Storage  Plan: Consult Status: NICU follow-up  NICU Follow-up type: Maternal D/C visit; Weekly NICU follow up; Verify onset of copious milk; Verify absence of engorgement  Mother to continue pumping q3h  Gwynne Edinger 08/22/2021, 10:07 AM

## 2021-08-22 NOTE — Clinical Social Work Maternal (Signed)
CLINICAL SOCIAL WORK MATERNAL/CHILD NOTE  Patient Details  Name: Catherine [name] MRN: [id number] Date of Birth: Mar 07, 1976  Date:  08/22/2021  Clinical Social Worker Initiating Note:  Joelene Millin Kamori Kitchens Date/Time: Initiated:  08/21/21/1430     Child's Name:  Catherine [name]   Biological Parents:  Mother, Father (Father: Marlene Bast)   Need for Interpreter:  None   Reason for Referral:  Parental Support of Children with Anomalies/Syndromes, Behavioral Health Concerns, Other (Comment) (limited prenatal care)   Address:  [place] [address]    Phone number:  [phone]   Additional phone number:   Household Members/Support Persons (HM/SP):   Household Member/Support Person 1   HM/SP Name Relationship DOB or Age  HM/SP -[place] daughter 12/21/12  HM/SP -2        HM/SP -3        HM/SP -4        HM/SP -5        HM/SP -6        HM/SP -7        HM/SP -8          Natural Supports (not living in the home):  Parent   Professional Supports: None   Employment: Unemployed   Type of Work:     Education:  Public librarian arranged:    Museum/gallery curator Resources:  [employer]   Other Resources:  [employer], Physicist, medical     Cultural/Religious Considerations Which May Impact Care:    Strengths:  Ability to meet basic needs  , Engineer, materials, Home prepared for child  , Understanding of illness   Psychotropic Medications:         Pediatrician:    [place]olic[place] area  Pediatrician List:   [facility] ([address])  [place]      Pediatrician Fax Number:    Risk Factors/Current Problems:  Mental Health Concerns  , Adjustment to Illness     Cognitive State:  Able to Concentrate  , Alert  , Linear Thinking  , Insightful  , Goal Oriented     Mood/Affect:  Interested  , Irritable  , Overwhelmed  , Agitated     CSW  Assessment: CSW met with MOB at bedside to complete psychosocial assessment, MOB was accompanied by her 95 year old daughter. MOB granted CSW verbal permission to speak in front of her daughter about anything. CSW introduced self and explained role. MOB was agreeable to meeting with CSW. MOB reported that she resides with her older daughter. MOB reported that she receives both [facility] and food stamps. MOB reported that she has all items needed to care for infant including a car seat, crib, and basinet. CSW inquired about MOB's support system, MOB reported that her dad is a support.   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 MOB shared her frustrations surrounding the consistent conversation about the diagnosis. MOB reported that it has been overwhelming. MOB reported that she is aware and not in denial but it is too much right now. CSW acknowledged, normalized, and validated MOB's feelings. MOB shared that she has limited support and will let it be known when she is ready to talk. MOB reported that it is her preference to not meet with genetics counselor tomorrow, CSW agreed to update team. MOB  reported that she just wants to focus on baby becoming medically stable for now. MOB reported that she is open to speaking about what is relevant in the moment but not future talk.   MOB's father came into the room, CSW introduced self. MOB granted CSW verbal permission to speak in front of her father about anything.   CSW inquired about MOB's mental health history. 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 MOB denied any current symptoms. MOB reported that she was diagnosed with Schizophrenia 5-6 years ago and denied any current auditory/visual hallucinations. MOB endorsed having auditory/visual hallucinations in the past and reported that was how she was diagnosed.  MOB shared this is how she knows what she can handle and what she can't. MOB denied any current symptoms of Schizophrenia. MOB reported that she is not currently taking any medication nor participating in therapy to treat her mental health diagnoses. MOB denied needing any resources for mental health. MOB denied any history of postpartum depression. CSW inquired about diagnoses mentioned on the chart anxiety, depression, and Bipolar. MOB denied a history of those diagnoses mentioned. CSW inquired MOB's coping skills, MOB reported that she is maintaining things and doesn't have time to dwell. CSW inquired about how MOB was feeling emotionally since giving birth, MOB reported that she is tired and just wants to rest and relax. MOB reported that she has been talking to people all day. CSW acknowledged the importance of rest and asked if a rest sign would be helpful, MOB reported yes. CSW agreed to follow up with RN. MOB presented agitated and overwhelmed. MOB did not demonstrate any acute mental health signs/symptoms. CSW assessed for safety, MOB denied SI, HI, and domestic violence.   CSW provided education regarding the baby blues period vs. perinatal mood disorders, discussed treatment and gave resources for mental health follow up if concerns arise.  CSW recommends self-evaluation during the postpartum time period using the New Mom Checklist from Postpartum Progress and encouraged MOB to contact a medical professional if symptoms are noted at any time.    CSW did not provide a review of Sudden Infant Death Syndrome (SIDS) precautions at this time. SIDS education will be provided prior to discharge.   CSW and MOB discussed infant's NICU admission. CSW asked how much longer assessment would be, CSW offered to stop and come back. MOB declined and agreed to finish assessment. CSW provided brief overview of NICU as MOB seemed to be becoming restless. MOB denied any transportation barriers with visiting infant in  the NICU. MOB reported that the NICU admission has been fine so far but shared it was frustrating not being able to hold infant, CSW normalized MOB's feelings of frustration surrounding not being able to hold infant and provided encouraging words. MOB denied any questions/concerns regarding the NICU.   CSW informed MOB about the hospital drug screen policy due to limited prenatal care. MOB confirmed only having 3 visits because she stopped going because of all the genetics stuff. MOB denied any barriers to obtaining prenatal care. CSW inquired about any substance use during pregnancy, MOB reported none. CSW informed MOB that infant's UDS and CDS would be monitored and a CPS report would be made if warranted. MOB verbalized understanding and denied any questions.    MOB opted to contact CSW if any needs/concerns arise versus CSW checking in weekly.   CSW updated NP of MOB's  request to not speak about diagnosis and preference to not meet with genetics counselor tomorrow.   CSW Plan/Description:  Perinatal Mood and Anxiety Disorder (PMADs) Education, Psychosocial Support and Ongoing Assessment of Needs, Hospital Drug Screen Policy Information, CSW Will Continue to Monitor Umbilical Cord Tissue Drug Screen Results and Make Report if Barbette Or, LCSW 08/22/2021, 8:50 AM

## 2021-08-23 ENCOUNTER — Ambulatory Visit: Payer: Medicaid Other

## 2021-08-24 ENCOUNTER — Encounter (HOSPITAL_COMMUNITY)
Admission: RE | Admit: 2021-08-24 | Discharge: 2021-08-24 | Disposition: A | Discharge status: Home / Self Care | Payer: Medicaid Other | Source: Ambulatory Visit | Attending: Obstetrics & Gynecology | Admitting: Obstetrics & Gynecology

## 2021-08-24 ENCOUNTER — Encounter: Payer: Medicaid Other | Admitting: Obstetrics & Gynecology

## 2021-08-26 ENCOUNTER — Inpatient Hospital Stay (HOSPITAL_COMMUNITY)
Admission: AD | Admit: 2021-08-26 | Payer: Medicaid Other | Source: Home / Self Care | Admitting: Obstetrics & Gynecology

## 2021-08-28 ENCOUNTER — Ambulatory Visit (INDEPENDENT_AMBULATORY_CARE_PROVIDER_SITE_OTHER): Payer: Medicaid Other

## 2021-08-28 ENCOUNTER — Encounter: Payer: Self-pay | Admitting: Obstetrics and Gynecology

## 2021-08-28 VITALS — BP 180/91 | HR 51

## 2021-08-28 DIAGNOSIS — Z0130 Encounter for examination of blood pressure without abnormal findings: Secondary | ICD-10-CM

## 2021-08-28 MED ORDER — NIFEDIPINE ER OSMOTIC RELEASE 30 MG PO TB24: 30.0000 mg | ORAL_TABLET | Freq: Two times a day (BID) | ORAL | 0 refills | Status: DC

## 2021-08-28 NOTE — Progress Notes (Signed)
S/w patient and advised of provider recommendations to go to MAU if systolic BP is greater than 497, diastolic greater than 026, pt agreed.

## 2021-08-28 NOTE — Progress Notes (Signed)
..  Subjective:  Catherine [name] is a 46 y.o. female here for BP check.  Pt is currently taking labetalol '300mg'$  BID Pt also declines any issue with c-section incision.  Hypertension ROS: taking medications as instructed, no medication side effects noted, no TIA's, no chest pain on exertion, no dyspnea on exertion, and no swelling of ankles.    Objective:  BP (!) 180/91   Pulse (!) 51   LMP 11/10/2020 (Exact Date)   Breastfeeding Yes   Appearance alert, well appearing, and in no distress. General exam BP noted to be well controlled today in office.  Removed honeycomb dressing in office today. Incision appears clean, dry, and intact.   Assessment:   Blood Pressure asymptomatic.   Plan:  Orders and follow up as documented in patient record. Consulted with Dr. Elgie Congo in office and Procardia '30mg'$  BID was added. Advised pt of abnormal signs/symptoms to report. Follow up BP check is scheduled for 09/05/21

## 2021-08-31 ENCOUNTER — Encounter: Payer: Medicaid Other | Admitting: Obstetrics and Gynecology

## 2021-09-05 ENCOUNTER — Ambulatory Visit (INDEPENDENT_AMBULATORY_CARE_PROVIDER_SITE_OTHER): Payer: Medicaid Other

## 2021-09-05 ENCOUNTER — Ambulatory Visit: Payer: Self-pay

## 2021-09-05 VITALS — BP 165/96 | HR 60 | Ht 64.5 in | Wt 168.0 lb

## 2021-09-05 DIAGNOSIS — Z0130 Encounter for examination of blood pressure without abnormal findings: Secondary | ICD-10-CM

## 2021-09-05 NOTE — Lactation Note (Signed)
This note was copied from a baby's chart. Lactation Consultation Note  Patient Name: Catherine [name] Date: 09/05/2021 Reason for consult: Follow-up assessment;Early term 37-38.6wks;NICU baby Age:44 wk.o.  Visited with mom of 63 weeks old ETI NICU female, she's a P2 and reports she stopped pumping 4 days ago. She voiced that she got engorged afterwards but just kept icing her breasts. Ms. Rosiles denies any S/S of engorgement at this point, she said there no longer any pain or discomfort at the breast. Baby is on 100% formula, currently on Similac 20 calorie formula. Lactation services are completed at this time.   Feeding Mother's Current Feeding Choice: Formula Nipple Type: Dr. Irving Burton Pre[facility]ie  Interventions Interventions: Education  Discharge Discharge Education: Engorgement and breast care  Consult Status Consult Status: Complete Date: 09/05/21 Follow-up type: Call as needed   Deiondre Harrower Venetia Constable 09/05/2021, 4:34 PM

## 2021-09-06 NOTE — Progress Notes (Signed)
Patient was assessed and managed by nursing staff during this encounter. I have reviewed the chart and agree with the documentation and plan. I have also made any necessary editorial changes.  Mora Bellman, MD 09/06/2021 10:28 AM

## 2021-09-18 ENCOUNTER — Other Ambulatory Visit: Payer: Self-pay | Admitting: Nurse Practitioner

## 2021-09-18 DIAGNOSIS — Z3490 Encounter for supervision of normal pregnancy, unspecified, unspecified trimester: Secondary | ICD-10-CM

## 2021-09-18 DIAGNOSIS — I10 Essential (primary) hypertension: Secondary | ICD-10-CM

## 2021-09-21 ENCOUNTER — Telehealth: Payer: Self-pay | Admitting: Nurse Practitioner

## 2021-09-21 DIAGNOSIS — I10 Essential (primary) hypertension: Secondary | ICD-10-CM

## 2021-09-21 DIAGNOSIS — Z3490 Encounter for supervision of normal pregnancy, unspecified, unspecified trimester: Secondary | ICD-10-CM

## 2021-09-21 NOTE — Telephone Encounter (Signed)
Amlodipine was discontinued 01/25/2022. Labetalol dosage was changed to '300mg'$  on 08/22/2021. Requested Prescriptions  Pending Prescriptions Disp Refills  . amLODipine (NORVASC) 10 MG tablet [Pharmacy Med Name: AMLODIPINE BESYLATE '10MG'$  TABLETS] 90 tablet 1    Sig: TAKE 1 TABLET(10 MG) BY MOUTH DAILY     Cardiovascular: Calcium Channel Blockers 2 Failed - 09/21/2021  3:25 PM      Failed - Last BP in normal range    BP Readings from Last 1 Encounters:  09/05/21 (!) 165/96         Failed - Valid encounter within last 6 months    Recent Outpatient Visits          7 months ago Essential hypertension   [place], Jarome Matin, RPH-CPP   7 months ago Pregnancy, unspecified gestational age   [place], [place], [place]   10 months ago Essential hypertension   [place], Jarome Matin, RPH-CPP   11 months ago Essential hypertension   [place], [place], RPH-CPP   1 year ago Essential hypertension   [place], Vernia Buff, NP      Future Appointments            In 5 days Gildardo Pounds, NP [place] - Last Heart Rate in normal range    Pulse Readings from Last 1 Encounters:  09/05/21 60         . labetalol (NORMODYNE) 100 MG tablet [Pharmacy Med Name: LABETALOL '100MG'$  TABLETS] 60 tablet 3    Sig: TAKE 1 TABLET(100 MG) BY MOUTH TWICE DAILY     Cardiovascular:  Beta Blockers Failed - 09/21/2021  3:25 PM      Failed - Last BP in normal range    BP Readings from Last 1 Encounters:  09/05/21 (!) 165/96         Failed - Valid encounter within last 6 months    Recent Outpatient Visits          7 months ago Essential hypertension   [place], Jarome Matin, RPH-CPP   7 months ago Pregnancy,  unspecified gestational age   [place], [place], [place]   10 months ago Essential hypertension   [place], Jarome Matin, RPH-CPP   11 months ago Essential hypertension   [place], RPH-CPP   1 year ago Essential hypertension   [place], Zelda W, NP      Future Appointments            In 5 days Gildardo Pounds, NP [place] - Last Heart Rate in normal range    Pulse Readings from Last 1 Encounters:  09/05/21 60

## 2021-09-26 ENCOUNTER — Encounter: Payer: Medicaid Other | Admitting: Nurse Practitioner

## 2021-09-26 DIAGNOSIS — I10 Essential (primary) hypertension: Secondary | ICD-10-CM

## 2021-09-26 NOTE — Progress Notes (Signed)
Patient is pregnant and being followed by OB/GYN until she delivers. Will defer BP management to them at this time

## 2021-10-03 ENCOUNTER — Encounter: Payer: Medicaid Other | Admitting: Nurse Practitioner

## 2021-10-03 ENCOUNTER — Telehealth: Payer: Medicaid Other | Admitting: Nurse Practitioner

## 2021-10-03 ENCOUNTER — Ambulatory Visit: Payer: Medicaid Other | Admitting: Obstetrics and Gynecology

## 2021-10-03 DIAGNOSIS — I10 Essential (primary) hypertension: Secondary | ICD-10-CM

## 2021-10-03 NOTE — Progress Notes (Signed)
Patient is under the care of OB GYN until her post partum check up later this week. She will follow up with me after seeing OB GYN.

## 2021-10-03 NOTE — Telephone Encounter (Signed)
Pt stated her Rx for medication labetalol (NORMODYNE) 300 MG tablet has expired and she is wanting to see if she can go back to prior medication amlodipine.    Pt stated she had an appointment this morning with PCP via MyChart; however, she was unable to connect. I called the office to make them aware I was unable to speak with anyone.I scheduled pt for 11/01/2021 at 8:30 a.m. with PA Freeman Caldron; however, pt is requesting an earlier appointment if possible to discuss medication.

## 2021-10-04 ENCOUNTER — Ambulatory Visit: Payer: Self-pay | Admitting: *Deleted

## 2021-10-04 NOTE — Telephone Encounter (Signed)
Pt is currently pregnant. HTN is being managed by the patient's OB team. Recommend she follow--up with them. Recommend to stay with labetalol for now

## 2021-10-04 NOTE — Telephone Encounter (Addendum)
Addendum: late entry. Patient reported she is not breast feeding.      Reviewed recent message from patient from [facility]. Chief Complaint: requesting medication changed from labetalol to amlodipine. Patient reports she is no longer pregnant had baby 6 weeks ago . Sx headaches Symptoms: headache, elevated BP in the 140-150/90's.  Frequency: na Pertinent Negatives: Patient denies blurred vision weakness on either side  Disposition: '[]'$ ED /'[x]'$ Urgent Care (no appt availability in office) / '[]'$ Appointment(In office/virtual)/ '[]'$  [facility]/ '[]'$ Home Care/ '[]'$ Refused Recommended Disposition /'[]'$ [place] [facility]/ '[x]'$  Follow-up with PCP Additional Notes:   Please advise if patient can gt enough medication to last until next visit. Please advise .   Reason for Disposition  [1] Caller has URGENT medicine question about med that PCP or specialist prescribed AND [2] triager unable to answer question  Answer Assessment - Initial Assessment Questions 1. NAME of MEDICINE: "What medicine(s) are you calling about?"     Labetalol and amlodipine 2. QUESTION: "What is your question?" (e.g., double dose of medicine, side effect)    Patient would like to switch from labetalol to amlodipine now that she has had her baby 6 weeks ago .   3. PRESCRIBER: "Who prescribed the medicine?" Reason: if prescribed by specialist, call should be referred to that group.     na 4. SYMPTOMS: "Do you have any symptoms?" If Yes, ask: "What symptoms are you having?"  "How bad are the symptoms (e.g., mild, moderate, severe)     Headaches  5. PREGNANCY:  "Is there any chance that you are pregnant?" "When was your last menstrual period?"     Not pregnant. Postpartum 6 weeks  Protocols used: Medication Question Call-A-AH

## 2021-10-08 NOTE — Telephone Encounter (Signed)
Thank you Catherine [name] for your input :).   Carilyn Goodpasture can you please let her know: She should already be taking labetalol and nifedipine (nifedipine) which is in the same family as amlodipine. She will need to stay on her blood pressure medication until seen in the office. She should be taking 2 blood pressure medications. It would not make sense to put her on amlodipine which is a calcium channel blocker when she is already on a calcium channel blocker nifedipine. She can come in for a nurse visit BP check before she sees angela but we would not be switching nifedipine back to amlodipine.

## 2021-10-12 NOTE — Telephone Encounter (Signed)
Left message with dad to return call.

## 2021-10-16 NOTE — Telephone Encounter (Signed)
Left another message with father at the 2nd number listed in the system. Patient has not returned phone call and unable to reach via 1st number.

## 2021-10-16 NOTE — Telephone Encounter (Signed)
Patient called back.  Best contact is her father's phone.   She has been off Labetolol x 1.5 mos. Unable to tolerate Nifedipine d/t really bad headaches.  BP readings have ranged from SBP- 141-160's And DBP- 88-94  Pls advise.

## 2021-10-17 ENCOUNTER — Ambulatory Visit (HOSPITAL_BASED_OUTPATIENT_CLINIC_OR_DEPARTMENT_OTHER): Payer: Medicaid Other | Admitting: Nurse Practitioner

## 2021-10-17 ENCOUNTER — Encounter: Payer: Self-pay | Admitting: Nurse Practitioner

## 2021-10-17 DIAGNOSIS — I10 Essential (primary) hypertension: Secondary | ICD-10-CM

## 2021-10-17 MED ORDER — AMLODIPINE BESYLATE 10 MG PO TABS: 10.0000 mg | ORAL_TABLET | Freq: Every day | ORAL | 1 refills | Status: DC

## 2021-10-17 MED ORDER — HYDROCHLOROTHIAZIDE 25 MG PO TABS: 25.0000 mg | ORAL_TABLET | Freq: Every day | ORAL | 3 refills | Status: DC

## 2021-10-17 NOTE — Progress Notes (Signed)
Virtual Visit via Telephone Note  I discussed the limitations, risks, security and privacy concerns of performing an evaluation and management service by telephone and the availability of in person appointments. I also discussed with the patient that there may be a patient responsible charge related to this service. The patient expressed understanding and agreed to proceed.    I connected with Catherine [name] on 10/17/21  at  10:10 AM EDT  EDT by telephone and verified that I am speaking with the correct person using two identifiers.  Location of Patient: Private Residence   Location of Provider: [place] participating in Telemedicine visit: [name] FNP-BC Yarianna Varble    History of Present Illness: Telemedicine visit for: HTN  Ms Buskey is a patient of mine who had been on HCTZ 25 mg and amlodipine 10 mg daily for quite some time until she became pregnant. At that time she was started on labetalol and nifedipine. She has since had her baby and is requesting to start her previous medications. She ran out of labetalol and states the nifedipine causes her to have headaches so she is currently not taking it. BP readings at home 140-160/80-90s.      Past Medical History:  Diagnosis Date   Advanced maternal age (AMA) in pregnancy    Anxiety 2017   Bipolar disorder ([place]) 2017   Chronic hypertension    Depression 2017   GERD (gastroesophageal reflux disease) 11/03/2012   Gestational diabetes    diet controlled   Gestational hypertension    Late prenatal care    Medical history non-contributory    Preeclampsia     Past Surgical History:  Procedure Laterality Date   CESAREAN SECTION N/A 12/21/2012   Procedure: CESAREAN SECTION;  Surgeon: Florian Buff, MD;  Location: [facility];  Service: Obstetrics;  Laterality: N/A;   CESAREAN SECTION N/A 08/20/2021   Procedure: CESAREAN SECTION;  Surgeon: Aletha Halim, MD;  Location: [facility];  Service: Obstetrics;  Laterality: N/A;   LAPAROSCOPIC OVARIAN  2004    Family History  Problem Relation Age of Onset   Hypertension Mother    Aneurysm Mother    Hypertension Father    Hypertension Paternal Aunt    Alcohol abuse Neg Hx    Arthritis Neg Hx    Asthma Neg Hx    Birth defects Neg Hx    Cancer Neg Hx    COPD Neg Hx    Depression Neg Hx    Diabetes Neg Hx    Drug abuse Neg Hx    Early death Neg Hx    Hearing loss Neg Hx    Heart disease Neg Hx    Hyperlipidemia Neg Hx    Kidney disease Neg Hx    Learning disabilities Neg Hx    Mental illness Neg Hx    Mental retardation Neg Hx    Miscarriages / Stillbirths Neg Hx    Stroke Neg Hx    Vision loss Neg Hx     Social History   Socioeconomic History   Marital status: Single    Spouse name: Not on file   Number of children: 1   Years of education: Not on file   Highest education level: Not on file  Occupational History   Not on file  Tobacco Use   Smoking status: Every Day    Types: Cigarettes   Smokeless tobacco: Never  Vaping Use   Vaping Use: Never used  Substance and Sexual Activity   Alcohol use: No   Drug use: No   Sexual activity: Yes    Partners: Male    Birth control/protection: None  Other Topics Concern   Not on file  Social History Narrative   Not on file   Social Determinants of Health   Financial Resource Strain: Not on file  Food Insecurity: Not on file  Transportation Needs: Not on file  Physical Activity: Not on file  Stress: Not on file  Social Connections: Not on file     Observations/Objective: Awake, alert and oriented x 3   Review of Systems  Constitutional:  Negative for fever, malaise/fatigue and weight loss.  HENT: Negative.  Negative for nosebleeds.   Eyes: Negative.  Negative for blurred vision, double vision and photophobia.  Respiratory: Negative.  Negative for cough and shortness of breath.   Cardiovascular: Negative.  Negative for chest pain,  palpitations and leg swelling.  Gastrointestinal: Negative.  Negative for heartburn, nausea and vomiting.  Musculoskeletal: Negative.  Negative for myalgias.  Neurological: Negative.  Negative for dizziness, focal weakness, seizures and headaches.  Psychiatric/Behavioral: Negative.  Negative for suicidal ideas.     Assessment and Plan: Diagnoses and all orders for this visit:  Primary hypertension -     hydrochlorothiazide (HYDRODIURIL) 25 MG tablet; Take 1 tablet (25 mg total) by mouth daily. CAN reduce milk supply if breastfeeding. -     amLODipine (NORVASC) 10 MG tablet; Take 1 tablet (10 mg total) by mouth daily. Continue all antihypertensives as prescribed.  Reminded to bring in blood pressure log for follow  up appointment.  RECOMMENDATIONS: DASH/Mediterranean Diets are healthier choices for HTN.      Follow Up Instructions Return for 11-01-2021 for HTN.     I discussed the assessment and treatment plan with the patient. The patient was provided an opportunity to ask questions and all were answered. The patient agreed with the plan and demonstrated an understanding of the instructions.   The patient was advised to call back or seek an in-person evaluation if the symptoms worsen or if the condition fails to improve as anticipated.  I provided 11 minutes of non-face-to-face time during this encounter including median intraservice time, reviewing previous notes, labs, imaging, medications and explaining diagnosis and management.  Gildardo Pounds, FNP-BC

## 2021-11-01 ENCOUNTER — Ambulatory Visit: Payer: Medicaid Other | Admitting: Physician Assistant

## 2022-05-17 ENCOUNTER — Encounter (HOSPITAL_COMMUNITY): Payer: Self-pay | Admitting: *Deleted

## 2022-05-17 ENCOUNTER — Ambulatory Visit (HOSPITAL_COMMUNITY)
Admission: EM | Admit: 2022-05-17 | Discharge: 2022-05-17 | Disposition: A | Discharge status: Home / Self Care | Payer: Medicaid Other | Attending: Sports Medicine | Admitting: Sports Medicine

## 2022-05-17 DIAGNOSIS — I10 Essential (primary) hypertension: Secondary | ICD-10-CM | POA: Diagnosis not present

## 2022-05-17 DIAGNOSIS — L03317 Cellulitis of buttock: Secondary | ICD-10-CM

## 2022-05-17 MED ORDER — TRAMADOL HCL 50 MG PO TABS: 50.0000 mg | ORAL_TABLET | Freq: Four times a day (QID) | ORAL | 0 refills | Status: AC | PRN

## 2022-05-17 MED ORDER — SULFAMETHOXAZOLE-TRIMETHOPRIM 800-160 MG PO TABS
1.0000 | ORAL_TABLET | Freq: Two times a day (BID) | ORAL | 0 refills | Status: AC
Start: 2022-05-17 — End: 2022-05-24

## 2022-05-17 NOTE — ED Provider Notes (Addendum)
[facility]    CSN: [id number] Arrival date & time: 05/17/22  [id number]      History   Chief Complaint Chief Complaint  Patient presents with   Abscess   Insect Bite    HPI Catherine [name] is a 47 y.o. female.   She is here today with chief complaint of possible insect bite on her left butt cheek.  She reports she had a small area of redness, pimple-like last week that she popped.  She had some relief of the discomfort however on Tuesday the area became more painful and larger.  Over the past couple days it is only worsened to the point where she is no longer able to sit comfortably or lay on her side secondary to the pain.  She denies any drainage from the area.  She has tried Tylenol and ibuprofen with minimal relief of her discomfort.  She started taking some amoxicillin that she had from previous dental infection however this was not improved the area.  Denies any fevers, chills, drainage from the area nausea or vomiting. She has not yet had her blood pressure medication this morning and has been less interested in eating secondary to her pain.  She denies any changes in vision, headache, shortness of breath or chest pain.   Abscess   Past Medical History:  Diagnosis Date   Advanced maternal age (AMA) in pregnancy    Anxiety 2017   Bipolar disorder ([place]) 2017   Chronic hypertension    Depression 2017   GERD (gastroesophageal reflux disease) 11/03/2012   Gestational diabetes    diet controlled   Gestational hypertension    Late prenatal care    Medical history non-contributory    Preeclampsia     Patient Active Problem List   Diagnosis Date Noted   Acute blood loss as cause of postoperative/postpartum anemia 08/21/2021   History of cesarean delivery 08/20/2021   Status post repeat low transverse cesarean section 08/20/2021   Advanced maternal age in multigravida, third trimester 08/10/2021   Breech presentation 08/10/2021   Prenatal care insufficient, third  trimester 08/02/2021   Chronic hypertension in obstetric context in third trimester 08/02/2021   Known fetal anomaly, antepartum 04/13/2021   Supervision of high risk pregnancy, antepartum 02/01/2021   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   Essential hypertension 01/05/2019   PTSD (post-traumatic stress disorder) 03/18/2017    Past Surgical History:  Procedure Laterality Date   CESAREAN SECTION N/A 12/21/2012   Procedure: CESAREAN SECTION;  Surgeon: Florian Buff, MD;  Location: [facility];  Service: Obstetrics;  Laterality: N/A;   CESAREAN SECTION N/A 08/20/2021   Procedure: CESAREAN SECTION;  Surgeon: Aletha Halim, MD;  Location: [facility];  Service: Obstetrics;  Laterality: N/A;   LAPAROSCOPIC OVARIAN  2004    OB History     Gravida  2   Para  2   Term  2   Preterm  0   AB  0   Living  2      SAB  0   IAB  0   Ectopic  0   Multiple  0   Live Births  2            Home Medications    Prior to Admission medications   Medication Sig Start Date End Date Taking? Authorizing Provider  amLODipine (NORVASC) 10 MG tablet Take 1 tablet (10 mg total) by mouth daily. 10/17/21  Yes Gildardo Pounds, NP  amoxicillin (AMOXIL) 875 MG tablet SMARTSIG:1 Tablet(s) By Mouth Every 12 Hours 04/28/22  Yes [provider]  hydrochlorothiazide (HYDRODIURIL) 25 MG tablet Take 1 tablet (25 mg total) by mouth daily. CAN reduce milk supply if breastfeeding. 10/17/21  Yes Gildardo Pounds, NP  sulfamethoxazole-trimethoprim (BACTRIM DS) 800-160 MG tablet Take 1 tablet by mouth 2 (two) times daily for 7 days. 05/17/22 05/24/22 Yes Tristyn Pharris A, DO  traMADol (ULTRAM) 50 MG tablet Take 1 tablet (50 mg total) by mouth every 6 (six) hours as needed for up to 5 days. 05/17/22 05/22/22 Yes Samael Blades A, DO  ibuprofen (ADVIL) 600 MG tablet Take 1 tablet (600 mg total) by mouth every 6 (six) hours as needed for moderate pain. 08/22/21   Anyanwu,  Sallyanne Havers, MD  labetalol (NORMODYNE) 300 MG tablet Take 1 tablet (300 mg total) by mouth 2 (two) times daily. 08/22/21   Anyanwu, Sallyanne Havers, MD  senna-docusate (SENOKOT-S) 8.6-50 MG tablet Take 2 tablets by mouth 2 (two) times daily as needed for mild constipation or moderate constipation. 08/22/21   Osborne Oman, MD    Family History Family History  Problem Relation Age of Onset   Hypertension Mother    Aneurysm Mother    Hypertension Father    Hypertension Paternal Aunt    Alcohol abuse Neg Hx    Arthritis Neg Hx    Asthma Neg Hx    Birth defects Neg Hx    Cancer Neg Hx    COPD Neg Hx    Depression Neg Hx    Diabetes Neg Hx    Drug abuse Neg Hx    Early death Neg Hx    Hearing loss Neg Hx    Heart disease Neg Hx    Hyperlipidemia Neg Hx    Kidney disease Neg Hx    Learning disabilities Neg Hx    Mental illness Neg Hx    Mental retardation Neg Hx    Miscarriages / Stillbirths Neg Hx    Stroke Neg Hx    Vision loss Neg Hx     Social History Social History   Tobacco Use   Smoking status: Every Day    Types: Cigarettes   Smokeless tobacco: Never  Vaping Use   Vaping Use: Never used  Substance Use Topics   Alcohol use: No   Drug use: No     Allergies   Patient has no known allergies.   Review of Systems Review of Systems as listed above in HPI   Physical Exam Triage Vital Signs ED Triage Vitals  Enc Vitals Group     BP 05/17/22 1001 (!) 168/109     Pulse Rate 05/17/22 1001 66     Resp 05/17/22 1001 18     Temp 05/17/22 1001 98.7 F (37.1 C)     Temp Source 05/17/22 1001 Oral     SpO2 05/17/22 1001 96 %     Weight --      Height --      Head Circumference --      Peak Flow --      Pain Score 05/17/22 0958 10     Pain Loc --      Pain Edu? --      Excl. in [place]? --    No data found.  Updated Vital Signs BP (!) 168/109 (BP Location: Left Arm)   Pulse 66   Temp 98.7 F (37.1 C) (Oral)   Resp 18   LMP  04/20/2022 (Approximate)   SpO2 96%    Physical Exam Vitals reviewed.  Constitutional:      General: She is not in acute distress.    Appearance: Normal appearance. She is not ill-appearing, toxic-appearing or diaphoretic.  HENT:     Head: Normocephalic.  Cardiovascular:     Rate and Rhythm: Normal rate.  Pulmonary:     Effort: Pulmonary effort is normal.  Skin:    Comments: Left gluteal region: There is a 10 cm area of induration with a 4 cm area of erythema, tenderness to palpation.  There is a scabbed area in the center of the area of induration, no drainage.  Excoriations around the area of induration.  No fluctuance palpated today. The area of erythema was marked with a skin marker today.  Neurological:     Mental Status: She is alert.     UC Treatments / Results  Labs (all labs ordered are listed, but only abnormal results are displayed) Labs Reviewed - No data to display  EKG   Radiology No results found.  Procedures Procedures (including critical care time)  Medications Ordered in UC Medications - No data to display  Initial Impression / Assessment and Plan / UC Course  I have reviewed the triage vital signs and the nursing notes.  Pertinent labs & imaging results that were available during my care of the patient were reviewed by me and considered in my medical decision making (see chart for details).     Cellulitis, left buttocks Counseled patient to discontinue the amoxicillin she was taking previously and begin the Bactrim that I have sent to her pharmacy 1 tablet twice a day for the next 7 days.  Counseled her to take this medication with food and drink lots of water.  Have also sent some tramadol to her pharmacy for pain.  She may continue to ice the area for pain relief.  The area of erythema has been marked, I recommend she follow-up with her primary care provider next week to ensure that the area is improving. No area of fluctuance today for I&D.  She verbalized understanding.   Asymptomatic hypertensive urgency, patient reports she has not taken her blood pressure medication the past 2 days as she has not been eating as much because of the pain in her gluteal region.  I counseled her to restart her blood pressure medication.  Follow-up with her primary care provider.  Follow-up with her primary care provider or [place] at the urgent care if symptoms worsen or fail to improve. Final Clinical Impressions(s) / UC Diagnoses   Final diagnoses:  Cellulitis of buttock     Discharge Instructions      You have an infection of the skin unfortunately there is nothing for [place] to drain today.  I have sent to your pharmacy an antibiotic that I want you to take twice a day for the next 7 days.  Take this medication with food to make sure you are staying hydrated.  I have also sent to your pharmacy some tramadol, medication for pain that you may take a couple times a day as needed.  I recommend follow-up with your primary care provider next week to recheck this area and make sure that it is improving.  I have circled the area of redness.  If the redness begins to spread beyond those borders follow-up with the primary care sooner or come back to the urgent care.  May also use some ice over the area to  help with some of the discomfort.    ED Prescriptions     Medication Sig Dispense Auth. Provider   traMADol (ULTRAM) 50 MG tablet Take 1 tablet (50 mg total) by mouth every 6 (six) hours as needed for up to 5 days. 20 tablet Rodena Goldmann A, DO   sulfamethoxazole-trimethoprim (BACTRIM DS) 800-160 MG tablet Take 1 tablet by mouth 2 (two) times daily for 7 days. 14 tablet Rodena Goldmann A, DO      I have reviewed the PDMP during this encounter.   Elmore Guise, DO 05/17/22 1031  Patient was treated today for cellulitis with options for antibiotics and pain medication.  She was also counseled on her elevated blood pressure, chronic medical condition with acute exacerbation  likely secondary to increased pain and medication noncompliance.   Rodena Goldmann A, DO 05/17/22 1037

## 2022-05-17 NOTE — ED Triage Notes (Signed)
Pt states that she has a spot on her left hip she noticed it Tuesday, she has been icing the location. She states she feels like over night last nigh it was worse. She has been taking amox '875mg'$  she had from some dental pain but it doesn't seem to be helping.

## 2022-05-17 NOTE — Discharge Instructions (Signed)
You have an infection of the skin unfortunately there is nothing for [place] to drain today.  I have sent to your pharmacy an antibiotic that I want you to take twice a day for the next 7 days.  Take this medication with food to make sure you are staying hydrated.  I have also sent to your pharmacy some tramadol, medication for pain that you may take a couple times a day as needed.  I recommend follow-up with your primary care provider next week to recheck this area and make sure that it is improving.  I have circled the area of redness.  If the redness begins to spread beyond those borders follow-up with the primary care sooner or come back to the urgent care.  May also use some ice over the area to help with some of the discomfort.

## 2023-09-11 ENCOUNTER — Other Ambulatory Visit (HOSPITAL_COMMUNITY): Payer: Self-pay

## 2024-03-23 ENCOUNTER — Emergency Department (HOSPITAL_COMMUNITY): Payer: MEDICAID

## 2024-03-23 ENCOUNTER — Other Ambulatory Visit: Payer: Self-pay

## 2024-03-23 ENCOUNTER — Emergency Department (HOSPITAL_COMMUNITY)
Admission: EM | Admit: 2024-03-23 | Discharge: 2024-03-23 | Disposition: A | Discharge status: Home / Self Care | Payer: MEDICAID | Attending: Emergency Medicine | Admitting: Emergency Medicine

## 2024-03-23 ENCOUNTER — Encounter (HOSPITAL_COMMUNITY): Payer: Self-pay | Admitting: *Deleted

## 2024-03-23 DIAGNOSIS — R112 Nausea with vomiting, unspecified: Secondary | ICD-10-CM | POA: Diagnosis present

## 2024-03-23 DIAGNOSIS — I10 Essential (primary) hypertension: Secondary | ICD-10-CM | POA: Insufficient documentation

## 2024-03-23 DIAGNOSIS — R059 Cough, unspecified: Secondary | ICD-10-CM | POA: Insufficient documentation

## 2024-03-23 LAB — CBC WITH DIFFERENTIAL/PLATELET
Abs Immature Granulocytes: 0.07 K/uL (ref 0.00–0.07)
Basophils Absolute: 0.1 K/uL (ref 0.0–0.1)
Basophils Relative: 1 %
Eosinophils Absolute: 0 K/uL (ref 0.0–0.5)
Eosinophils Relative: 0 %
HCT: 46.9 % — ABNORMAL HIGH (ref 36.0–46.0)
Hemoglobin: 15.1 g/dL — ABNORMAL HIGH (ref 12.0–15.0)
Immature Granulocytes: 1 %
Lymphocytes Relative: 9 %
Lymphs Abs: 1.2 K/uL (ref 0.7–4.0)
MCH: 27.7 pg (ref 26.0–34.0)
MCHC: 32.2 g/dL (ref 30.0–36.0)
MCV: 86.1 fL (ref 80.0–100.0)
Monocytes Absolute: 0.9 K/uL (ref 0.1–1.0)
Monocytes Relative: 7 %
Neutro Abs: 11.6 K/uL — ABNORMAL HIGH (ref 1.7–7.7)
Neutrophils Relative %: 82 %
Platelets: 412 K/uL — ABNORMAL HIGH (ref 150–400)
RBC: 5.45 MIL/uL — ABNORMAL HIGH (ref 3.87–5.11)
RDW: 15.7 % — ABNORMAL HIGH (ref 11.5–15.5)
WBC: 13.9 K/uL — ABNORMAL HIGH (ref 4.0–10.5)
nRBC: 0 % (ref 0.0–0.2)

## 2024-03-23 LAB — URINALYSIS, ROUTINE W REFLEX MICROSCOPIC
Bilirubin Urine: NEGATIVE
Glucose, UA: NEGATIVE mg/dL
Hgb urine dipstick: NEGATIVE
Ketones, ur: 5 mg/dL — AB
Leukocytes,Ua: NEGATIVE
Nitrite: NEGATIVE
Protein, ur: >=300 mg/dL — AB
Specific Gravity, Urine: 1.032 — ABNORMAL HIGH (ref 1.005–1.030)
pH: 5 (ref 5.0–8.0)

## 2024-03-23 LAB — COMPREHENSIVE METABOLIC PANEL WITH GFR
ALT: 17 U/L (ref 0–44)
AST: 25 U/L (ref 15–41)
Albumin: 5 g/dL (ref 3.5–5.0)
Alkaline Phosphatase: 103 U/L (ref 38–126)
Anion gap: 17 — ABNORMAL HIGH (ref 5–15)
BUN: 13 mg/dL (ref 6–20)
CO2: 26 mmol/L (ref 22–32)
Calcium: 10.2 mg/dL (ref 8.9–10.3)
Chloride: 101 mmol/L (ref 98–111)
Creatinine, Ser: 0.68 mg/dL (ref 0.44–1.00)
GFR, Estimated: >60 mL/min
Glucose, Bld: 152 mg/dL — ABNORMAL HIGH (ref 70–99)
Potassium: 3.3 mmol/L — ABNORMAL LOW (ref 3.5–5.1)
Sodium: 144 mmol/L (ref 135–145)
Total Bilirubin: 0.6 mg/dL (ref 0.0–1.2)
Total Protein: 9 g/dL — ABNORMAL HIGH (ref 6.5–8.1)

## 2024-03-23 LAB — MAGNESIUM: Magnesium: 1.9 mg/dL (ref 1.7–2.4)

## 2024-03-23 LAB — RESP PANEL BY RT-PCR (RSV, FLU A&B, COVID)  RVPGX2
Influenza A by PCR: NEGATIVE
Influenza B by PCR: NEGATIVE
Resp Syncytial Virus by PCR: NEGATIVE
SARS Coronavirus 2 by RT PCR: NEGATIVE

## 2024-03-23 LAB — LIPASE, BLOOD: Lipase: 47 U/L (ref 11–51)

## 2024-03-23 LAB — HCG, SERUM, QUALITATIVE: Preg, Serum: NEGATIVE

## 2024-03-23 MED ORDER — ONDANSETRON 4 MG PO TBDP
4.0000 mg | ORAL_TABLET | Freq: Once | ORAL | Status: AC
  Administered 2024-03-23: 4 mg via ORAL
  Filled 2024-03-23: qty 1

## 2024-03-23 MED ORDER — ONDANSETRON HCL 4 MG/2ML IJ SOLN
4.0000 mg | Freq: Once | INTRAMUSCULAR | Status: AC
  Administered 2024-03-23: 4 mg via INTRAVENOUS
  Filled 2024-03-23: qty 2

## 2024-03-23 MED ORDER — LABETALOL HCL 200 MG PO TABS
300.0000 mg | ORAL_TABLET | Freq: Two times a day (BID) | ORAL | Status: DC
  Administered 2024-03-23 (×2): 300 mg via ORAL
  Filled 2024-03-23 (×2): qty 2

## 2024-03-23 MED ORDER — ONDANSETRON 4 MG PO TBDP: 4.0000 mg | ORAL_TABLET | Freq: Three times a day (TID) | ORAL | 0 refills | Status: AC | PRN

## 2024-03-23 MED ORDER — AMLODIPINE BESYLATE 10 MG PO TABS: 10.0000 mg | ORAL_TABLET | Freq: Every day | ORAL | 1 refills | Status: AC

## 2024-03-23 MED ORDER — SODIUM CHLORIDE 0.9 % IV BOLUS
1000.0000 mL | Freq: Once | INTRAVENOUS | Status: AC
  Administered 2024-03-23: 1000 mL via INTRAVENOUS

## 2024-03-23 MED ORDER — PANTOPRAZOLE SODIUM 20 MG PO TBEC: 40.0000 mg | DELAYED_RELEASE_TABLET | Freq: Every day | ORAL | 0 refills | Status: AC

## 2024-03-23 MED ORDER — HYDROCHLOROTHIAZIDE 25 MG PO TABS: 25.0000 mg | ORAL_TABLET | Freq: Every day | ORAL | 0 refills | Status: AC

## 2024-03-23 MED ORDER — LOPERAMIDE HCL 2 MG PO CAPS: 2.0000 mg | ORAL_CAPSULE | Freq: Four times a day (QID) | ORAL | 0 refills | Status: AC | PRN

## 2024-03-23 MED ORDER — AMLODIPINE BESYLATE 5 MG PO TABS
10.0000 mg | ORAL_TABLET | Freq: Every day | ORAL | Status: DC
  Administered 2024-03-23: 10 mg via ORAL
  Filled 2024-03-23: qty 2

## 2024-03-23 MED ORDER — LABETALOL HCL 300 MG PO TABS: 300.0000 mg | ORAL_TABLET | Freq: Two times a day (BID) | ORAL | 0 refills | Status: AC

## 2024-03-23 NOTE — ED Provider Triage Note (Signed)
 Emergency Medicine Provider Triage Evaluation Note  Dilia Alemany , a 49 y.o. female  was evaluated in triage.  Pt complains of multiple complaints.  Review of Systems  Positive: Nausea, vomiting, diarrhea, headache, fever, cough, abdominal pain Negative: Numbness, weakness  Physical Exam  BP (!) 211/111 (BP Location: Left Arm)   Pulse 76   Temp 98.3 F (36.8 C) (Oral)   Resp (!) 22   LMP 03/12/2024 (Exact Date)   SpO2 100%  Gen:   Awake, no distress   Resp:  Normal effort  MSK:   Moves extremities without difficulty   Medical Decision Making  Medically screening exam initiated at 1:00 PM.  Appropriate orders placed.  Graceland Wachter was informed that the remainder of the evaluation will be completed by another provider, this initial triage assessment does not replace that evaluation, and the importance of remaining in the ED until their evaluation is complete.  Patient presenting for 2 to 3 days of flulike symptoms.  She states that she has not been able to eat and has not been able to keep down any fluids.  She has had headache and what she describes as a generalized abdominal pain as well.  Blood pressure with EMS was elevated secondary to patient unable to tolerate her home medications.   Melvenia Motto, MD 03/23/24 1301

## 2024-03-23 NOTE — ED Notes (Addendum)
 Verbally notified PA, pt vomiting in waiting room bathroom. KM

## 2024-03-23 NOTE — ED Triage Notes (Signed)
 Per EMS patient is here with n/v/d cough, fever, headache for 2 days and unable bp meds down. BP 191/120, p-70, r-16, 96% RA CBG 151.

## 2024-03-23 NOTE — ED Provider Notes (Signed)
 "  Emergency Department Provider Note   I have reviewed the triage vital signs and the nursing notes.   HISTORY  Chief Complaint Emesis, Fever, and Cough   HPI Catherine [name] is a 49 y.o. female with past history reviewed below who presents to the emergency department with vomiting, epigastric pain, cough.  Symptoms have been present for the past 2 days.  She notes that because of vomiting she is been unable to keep down her blood pressure medications.  She has had some associated fever.  No chest pain or shortness of breath.  She had diarrhea but that seemed to resolve yesterday.  No blood in the emesis.  Past Medical History:  Diagnosis Date   Advanced maternal age (AMA) in pregnancy    Anxiety 2017   Bipolar disorder (HCC) 2017   Chronic hypertension    Depression 2017   GERD (gastroesophageal reflux disease) 11/03/2012   Gestational diabetes    diet controlled   Gestational hypertension    Late prenatal care    Medical history non-contributory    Preeclampsia     Review of Systems  Constitutional: No fever/chills Cardiovascular: Negative chest pain. Respiratory: Denies shortness of breath. Positive cough.  Gastrointestinal: Positive epigastric abdominal pain. Positive nausea, vomiting, and diarrhea.  Skin: Negative for rash. Neurological: Negative for headaches, focal weakness or numbness.  ____________________________________________   PHYSICAL EXAM:  VITAL SIGNS: ED Triage Vitals  Encounter Vitals Group     BP 03/23/24 1215 (!) 211/111     Pulse Rate 03/23/24 1215 76     Resp 03/23/24 1215 (!) 22     Temp 03/23/24 1212 98.3 F (36.8 C)     Temp Source 03/23/24 1212 Oral     SpO2 03/23/24 1215 100 %   Constitutional: Alert and oriented. Well appearing and in no acute distress. Eyes: Conjunctivae are normal. Head: Atraumatic. Nose: No congestion/rhinnorhea. Mouth/Throat: Mucous membranes are moist.   Neck: No stridor.   Cardiovascular: Normal  rate, regular rhythm. Good peripheral circulation. Grossly normal heart sounds.   Respiratory: Normal respiratory effort.  No retractions. Lungs CTAB. Gastrointestinal: Soft and nontender. No distention.  Musculoskeletal: No lower extremity tenderness nor edema. No gross deformities of extremities. Neurologic:  Normal speech and language. No gross focal neurologic deficits are appreciated.  Skin:  Skin is warm, dry and intact. No rash noted.  ____________________________________________   LABS (all labs ordered are listed, but only abnormal results are displayed)  Labs Reviewed  COMPREHENSIVE METABOLIC PANEL WITH GFR - Abnormal; Notable for the following components:      Result Value   Potassium 3.3 (*)    Glucose, Bld 152 (*)    Total Protein 9.0 (*)    Anion gap 17 (*)    All other components within normal limits  CBC WITH DIFFERENTIAL/PLATELET - Abnormal; Notable for the following components:   WBC 13.9 (*)    RBC 5.45 (*)    Hemoglobin 15.1 (*)    HCT 46.9 (*)    RDW 15.7 (*)    Platelets 412 (*)    Neutro Abs 11.6 (*)    All other components within normal limits  URINALYSIS, ROUTINE W REFLEX MICROSCOPIC - Abnormal; Notable for the following components:   Color, Urine AMBER (*)    APPearance HAZY (*)    Specific Gravity, Urine 1.032 (*)    Ketones, ur 5 (*)    Protein, ur >=300 (*)    Bacteria, UA RARE (*)    All other  components within normal limits  RESP PANEL BY RT-PCR (RSV, FLU A&B, COVID)  RVPGX2  LIPASE, BLOOD  HCG, SERUM, QUALITATIVE  MAGNESIUM    ____________________________________________  EKG   EKG Interpretation Date/Time:  Monday March 23 2024 13:40:07 EST Ventricular Rate:  73 PR Interval:  118 QRS Duration:  136 QT Interval:  408 QTC Calculation: 449 R Axis:   99  Text Interpretation: Normal sinus rhythm Right bundle branch block T wave abnormality, consider inferolateral ischemia Abnormal ECG No previous ECGs available Confirmed by Darra Chew [phone]) on 03/23/2024 9:51:48 PM        ____________________________________________  RADIOLOGY  ARCOLA Abd Acute W/Chest Result Date: 03/23/2024 CLINICAL DATA:  Nausea/vomiting/diarrhea, abdominal pain EXAM: DG ABDOMEN ACUTE WITH 1 VIEW CHEST COMPARISON:  None Available. FINDINGS: Supine and upright frontal views of the abdomen as well as an upright frontal view of the chest are obtained. The cardiac silhouette is unremarkable. No acute airspace disease, effusion, or pneumothorax. No bowel obstruction or ileus. No masses or abnormal calcifications. Numerous phleboliths are seen within the pelvis. No free gas within the greater peritoneal sac. No acute bony abnormalities. IMPRESSION: 1. No acute intrathoracic process. 2. Unremarkable bowel gas pattern. Electronically Signed   By: Ozell Daring M.D.   On: 03/23/2024 15:26    ____________________________________________   PROCEDURES  Procedure(s) performed:   Procedures  None  ____________________________________________   INITIAL IMPRESSION / ASSESSMENT AND PLAN / ED COURSE  Pertinent labs & imaging results that were available during my care of the patient were reviewed by me and considered in my medical decision making (see chart for details).   This patient is Presenting for Evaluation of vomiting, which does require a range of treatment options, and is a complaint that involves a moderate risk of morbidity and mortality.  The Differential Diagnoses include viral GI process, cholecystitis, pancreatitis, Flu, COVID, etc.  Critical Interventions-    Medications  amLODipine  (NORVASC ) tablet 10 mg (10 mg Oral Given 03/23/24 1338)  labetalol  (NORMODYNE ) tablet 300 mg (300 mg Oral Given 03/23/24 1338)  ondansetron  (ZOFRAN -ODT) disintegrating tablet 4 mg (4 mg Oral Given 03/23/24 1337)  sodium chloride  0.9 % bolus 1,000 mL (1,000 mLs Intravenous New Bag/Given 03/23/24 2130)  ondansetron  (ZOFRAN ) injection 4 mg (4 mg Intravenous  Given 03/23/24 2132)    Reassessment after intervention: symptoms improved.    Clinical Laboratory Tests Ordered, included CBC without leukocytosis to 13.9. No anemia. No AKI. UA without infection. Lipase negative. COVID/Flu negative.   Radiologic Tests Ordered, included abdominal XR. I independently interpreted the images and agree with radiology interpretation.   Cardiac Monitor Tracing which shows NSR.    Social Determinants of Health Risk patient is a smoker.   Medical Decision Making: Summary:  Patient presents to the ED with abdominal pain and vomiting. Seems most consistent with viral process. Abdomen is diffusely soft and non-tender. No clear indication for emergent CT abdomen/pelvis. Plan for IVF and d/c home with Zofran  and refills of home BP meds. She is keeping these medications down here in the ED. No CP.   Patient's presentation is most consistent with acute presentation with potential threat to life or bodily function.   Disposition: discharge  ____________________________________________  FINAL CLINICAL IMPRESSION(S) / ED DIAGNOSES  Final diagnoses:  Nausea and vomiting, unspecified vomiting type  Hypertension, unspecified type     NEW OUTPATIENT MEDICATIONS STARTED DURING THIS VISIT:  New Prescriptions   LOPERAMIDE  (IMODIUM ) 2 MG CAPSULE    Take 1 capsule (2 mg  total) by mouth 4 (four) times daily as needed for diarrhea or loose stools.   ONDANSETRON  (ZOFRAN -ODT) 4 MG DISINTEGRATING TABLET    Take 1 tablet (4 mg total) by mouth every 8 (eight) hours as needed.   PANTOPRAZOLE  (PROTONIX ) 20 MG TABLET    Take 2 tablets (40 mg total) by mouth daily.    Note:  This document was prepared using Dragon voice recognition software and may include unintentional dictation errors.  Fonda Law, MD, [facility] Emergency Medicine    Hassen Bruun, Fonda MATSU, MD 03/23/24 2156  "

## 2024-03-23 NOTE — Discharge Instructions (Signed)
You have been seen in the Emergency Department (ED) today for nausea and vomiting.  Your work up today has not shown a clear cause for your symptoms. °You have been prescribed Zofran; please use as prescribed as needed for your nausea. ° °Follow up with your doctor as soon as possible regarding today?s emergent visit and your symptoms of nausea.  ° °Return to the ED if you develop abdominal, bloody vomiting, bloody diarrhea, if you are unable to tolerate fluids due to vomiting, or if you develop other symptoms that concern you. ° °

## 2024-03-23 NOTE — ED Notes (Signed)
 Pt successfully passed PO challenge
